# Patient Record
Sex: Female | Born: 1965 | Race: White | Hispanic: No | Marital: Married | Smoking: Former smoker
Health system: Southern US, Community
[De-identification: ages and names within clinical notes are randomized; demographics above are authoritative.]

## PROBLEM LIST (undated history)

## (undated) DIAGNOSIS — K5792 Diverticulitis of intestine, part unspecified, without perforation or abscess without bleeding: Secondary | ICD-10-CM

## (undated) DIAGNOSIS — E039 Hypothyroidism, unspecified: Secondary | ICD-10-CM

## (undated) DIAGNOSIS — E079 Disorder of thyroid, unspecified: Secondary | ICD-10-CM

## (undated) HISTORY — PX: TUBAL LIGATION: SHX77

## (undated) HISTORY — DX: Disorder of thyroid, unspecified: E07.9

## (undated) HISTORY — PX: ENDOMETRIAL ABLATION: SHX621

## (undated) HISTORY — DX: Diverticulitis of intestine, part unspecified, without perforation or abscess without bleeding: K57.92

## (undated) HISTORY — PX: LAPAROSCOPIC SUPRACERVICAL HYSTERECTOMY: SUR797

## (undated) HISTORY — PX: EYE SURGERY: SHX253

---

## 1999-01-11 ENCOUNTER — Ambulatory Visit (HOSPITAL_BASED_OUTPATIENT_CLINIC_OR_DEPARTMENT_OTHER): Admission: RE | Admit: 1999-01-11 | Discharge: 1999-01-11 | Payer: Self-pay | Admitting: Ophthalmology

## 2002-03-24 ENCOUNTER — Ambulatory Visit (HOSPITAL_COMMUNITY): Admission: RE | Admit: 2002-03-24 | Discharge: 2002-03-24 | Payer: Self-pay | Admitting: Family Medicine

## 2002-03-24 ENCOUNTER — Encounter: Payer: Self-pay | Admitting: Family Medicine

## 2004-09-25 ENCOUNTER — Ambulatory Visit: Payer: Self-pay | Admitting: Internal Medicine

## 2004-10-18 ENCOUNTER — Ambulatory Visit: Payer: Self-pay | Admitting: Internal Medicine

## 2006-02-05 ENCOUNTER — Encounter (INDEPENDENT_AMBULATORY_CARE_PROVIDER_SITE_OTHER): Payer: Self-pay | Admitting: *Deleted

## 2006-02-05 ENCOUNTER — Ambulatory Visit (HOSPITAL_COMMUNITY): Admission: RE | Admit: 2006-02-05 | Discharge: 2006-02-05 | Payer: Self-pay | Admitting: Obstetrics and Gynecology

## 2006-06-29 ENCOUNTER — Encounter (INDEPENDENT_AMBULATORY_CARE_PROVIDER_SITE_OTHER): Payer: Self-pay | Admitting: *Deleted

## 2006-06-29 ENCOUNTER — Observation Stay (HOSPITAL_COMMUNITY): Admission: RE | Admit: 2006-06-29 | Discharge: 2006-06-30 | Payer: Self-pay | Admitting: Obstetrics and Gynecology

## 2007-09-08 ENCOUNTER — Encounter: Admission: RE | Admit: 2007-09-08 | Discharge: 2007-09-08 | Payer: Self-pay | Admitting: Family Medicine

## 2009-10-03 ENCOUNTER — Encounter (INDEPENDENT_AMBULATORY_CARE_PROVIDER_SITE_OTHER): Payer: Self-pay | Admitting: *Deleted

## 2009-12-27 ENCOUNTER — Encounter: Admission: RE | Admit: 2009-12-27 | Discharge: 2009-12-27 | Payer: Self-pay | Admitting: Obstetrics and Gynecology

## 2010-02-20 ENCOUNTER — Other Ambulatory Visit: Admission: RE | Admit: 2010-02-20 | Discharge: 2010-02-20 | Payer: Self-pay | Admitting: Obstetrics and Gynecology

## 2010-09-01 ENCOUNTER — Encounter: Payer: Self-pay | Admitting: Family Medicine

## 2010-09-10 NOTE — Letter (Signed)
Summary: Colonoscopy Date Change Letter  [facility]  [address] [place], [place] [address]   Phone: [phone]  Fax: [phone]      October 03, 2009 MRN: [id number]   Joyce Davis [address] [place], [place]  [address]   Dear Ms. Ozbun,   Previously you were recommended to have a repeat colonoscopy around this time. Your chart was recently reviewed by Dr. Hedwig Morton. Juanda Chance of [facility]. Follow up colonoscopy is now recommended in March 2013. This revised recommendation is based on current, nationally recognized guidelines for colorectal cancer screening and polyp surveillance. These guidelines are endorsed by the American Cancer Society, [employer] on Colorectal Cancer as well as numerous other major medical organizations.  Please understand that our recommendation assumes that you do not have any new symptoms such as bleeding, a change in bowel habits, anemia, or significant abdominal discomfort. If you do have any concerning GI symptoms or want to discuss the guideline recommendations, please call to arrange an office visit at your earliest convenience. Otherwise we will keep you in our reminder system and contact you 1-2 months prior to the date listed above to schedule your next colonoscopy.  Thank you,  Hedwig Morton. Juanda Chance, M.D.  [facility] [phone]

## 2010-12-27 NOTE — H&P (Signed)
Joyce Davis, Joyce Davis               ACCOUNT NO.:  [id number]   MEDICAL RECORD NO.:  [id number]          PATIENT TYPE:  AMB   LOCATION:  DAY                           FACILITY:  [facility]   PHYSICIAN:  Tilda Burrow, M.D. DATE OF BIRTH:  1966-03-20   DATE OF ADMISSION:  DATE OF DISCHARGE:  LH                              HISTORY & PHYSICAL   ADMITTING DIAGNOSES:  Menorrhagia, status post failed endometrial  ablation, possible adenomyosis.   HISTORY OF PRESENT ILLNESS:  This 45 year old female 4 months' status  post endometrial ablation that resulted in 2 excellent menses but with  recurrence of her unacceptably heavy periods and with the last menses  lasting 5 days long and painful.  The patient is admitted at this time  for a laparoscopic supracervical hysterectomy.  Joyce Davis has a history  of normal Pap smears, has tolerated the anesthesia of endometrial  ablation without difficulty.  Has ultrasound-confirmed relatively upper  limits of normal sized uterus.  She is admitted for laparoscopic  supracervical hysterectomy.  The technical aspects of the procedure have  been reviewed using [place] instruction booklets for both laparoscopic  supracervical and vaginal hysterectomy performed with vaginal  laparoscopic assistance.  The patient has chosen to proceed with [facility] to  maintain optimal support.  Ovary preservation is planned.  The risks and  benefits are well explained and the patient shows a clear understanding  of issues.  The endometrial tissue from the endometrial curettage showed  benign tissue from the endometrium.   PAST MEDICAL HISTORY:  1. Essentially benign surgical tubal ligation in 1993.  2. Endometrial ablation, May 2006.   PHYSICAL EXAMINATION:  Pupils equal, round, and reactive.  NECK:  Supple.  CHEST:  Clear to auscultation.  Normal thyroid.  HEART:  Regular rate and rhythm.  LUNGS:  Clear.  ABDOMEN:  Nontender without significant surgical scars.  External  genitalia normal.  Vaginal exam shows nonpurulent secretions.  Uterus anteflexed, well supported, upper  limits of normal size.  Adnexa nontender.  Rectal support adequate.   PLAN:  Laparoscopic supracervical hysterectomy, June 29, 2006.      Tilda Burrow, M.D.  Electronically Signed     JVF/MEDQ  D:  06/28/2006  T:  06/29/2006  Job:  [id number]   cc:   [facility]

## 2010-12-27 NOTE — Op Note (Signed)
Joyce Davis, Joyce Davis               ACCOUNT NO.:  [id number]   MEDICAL RECORD NO.:  [id number]          PATIENT TYPE:  AMB   LOCATION:  DAY                           FACILITY:  [facility]   PHYSICIAN:  Tilda Burrow, M.D. DATE OF BIRTH:  03-26-1966   DATE OF PROCEDURE:  06/29/2006  DATE OF DISCHARGE:                               OPERATIVE REPORT   PREOPERATIVE DIAGNOSIS:  Menorrhagia, failed endometrial ablation.   POSTOPERATIVE DIAGNOSIS:  Menorrhagia, failed endometrial ablation.  Bilateral proximal hydrosalpinx.   OPERATION PERFORMED:  Laparoscopic supracervical hysterectomy.   SURGEON:  Tilda Burrow, M.D.   ASSISTANT:  Lazaro Arms, M.D.   ANESTHESIA:  General.   COMPLICATIONS:  None.   FINDINGS:  Mobile, well-supported uterus, small ovaries.  Small proximal  hydrosalpinx on each side, status post tubal ligation and ablation.   DESCRIPTION OF PROCEDURE:  The patient was taken to the operating room,  prepped and draped for combined abdominal and vaginal procedure with the  cervix grasped with single toothed tenaculum and Foley catheter in  place.  An infraumbilical vertical 1 cm skin incision was made as well  as the transverse suprapubic incision and bilateral lower quadrant  incisions of similar length.  A Veress needle was introduced through the  umbilicus.  Pneumoperitoneum achieved under 12 mmHg after water droplet  test confirmed satisfactory intraperitoneal positioning of the needle  tip.  Insufflation of the abdomen was followed by insertion of the 10 mm  laparoscopic trocar through the umbilicus under direct camera guidance  and then inspection of the abdomen showed no evidence of trauma to  internal organs.  Suprapubic trocar was introduced under direct  visualization as was the right lower quadrant 12 mm trocar.  The left  lower quadrant trocar was also a 5 mm trocar.  Attention was then  directed to the uterus.  Dr. Despina Hidden held the position on the left side  of  the patient and we started on that side with taking down of the broad  ligament, utero-ovarian ligament and round ligament with the Harmonic  scalpel taking serial small bites with good hemostasis.  On the right  side, a similar technique was used, with a small amount of oozing from  the pedicle of the ovary.  The bladder flap was developed anteriorly.  Attention was then directed to the uterine vessels.  On each side, the  uterine vessels could be clearly identified and broad area coagulated  using Harmonic scalpel on low power setting and then the vessels were  transected bilaterally.  The cervix was then amputated off at the level  of the insertion of the uterosacral ligaments into the posterior aspect  of the cervix, and the surgical specimen removed by morcellation and  removing it through the morcellator plate which had been placed in the  right lower quadrant.  Specimen was removed.  Inspection of the pelvis  followed and there was a small area with a large open vein on the pelvic  floor just anterior and to the right of the insertion of the right  uterosacral ligament into the cervical  stump.  This was coagulated.  We  also had some oozing from the pedicle at the end of the right utero-  ovarian ligament and this was carefully coagulated under low pressure  Harmonic scalpel settings to achieve hemostasis.  Copious pelvic  irrigation was performed under low pressure to see if there was any  additional oozing and there was none.  The upper abdomen was inspected  and no tissue samples identified that had been tossed out of the field  of vision by the extraction process.  Deflation of the abdomen followed,  the surgical instruments having been removed.  The right lower quadrant  and umbilical sites were closed at the fascial level and then all five  sites were closed with subcuticular suture of Dexon followed by surgical  glue over the incision.  Hemostasis was good.  The estimated  blood loss  was 50 mL.  Condition to recovery good.  Patient moved herself off the  table.      Tilda Burrow, M.D.  Electronically Signed     JVF/MEDQ  D:  06/29/2006  T:  06/29/2006  Job:  [id number]

## 2010-12-27 NOTE — Op Note (Signed)
Joyce Davis, Joyce Davis               ACCOUNT NO.:  [id number]   MEDICAL RECORD NO.:  [id number]          PATIENT TYPE:  AMB   LOCATION:  DAY                           FACILITY:  [facility]   PHYSICIAN:  Tilda Burrow, M.D. DATE OF BIRTH:  September 02, 1965   DATE OF PROCEDURE:  DATE OF DISCHARGE:                                 OPERATIVE REPORT   PREOPERATIVE DIAGNOSIS:  Menorrhagia.   POSTOPERATIVE DIAGNOSES:  Menorrhagia.  Endometrial hyperplasia.   PROCEDURE:  Hysteroscopy, dilatation and curettage, endometrial ablation.   SURGEON:  Ferguson.   ASSISTANT:  None.   ANESTHESIA:  General.   COMPLICATIONS:  Technically challenging procedure with hysteroscopy  visualization of the endometrial cavity limited.  Thickened endometrium  requiring full suction curettage, obtaining generous amounts of tissue.   DETAILS OF PROCEDURE:  The patient was taken to the OR, prepped and draped  for vaginal procedure.  A single-tooth tenaculum was used to grasp the  cervix through a standard Graves bivalve speculum, with the cervix grasped,  placed under traction, the uterus sounded to 11 cm, with serial dilation to  27 [place] performed.  Hysteroscopy was then initiated with the 30-degree  angle, 27-mm diameter, rigid hysteroscope.  The hysteroscope could be  introduced and once in position we had trouble getting the uterus to  irrigate.  It became apparent that one of the internal mechanisms was not  functioning optimally and the irrigation fluid was passing from the insert  to the evacuation port without passage through the uterine cavity.  Nonetheless, with cutting on and off of the inflow and outflow ports in  sequence, we were able to flush out the uterine cavity enough to visualize  an endometrial cavity that had extensive shaggy polypoid-appearing  endometrial cavity surface in all quadrants.  We then proceeded with smooth  sharp curettage which started to obtain generous chunks of tissue from all  surfaces.  The 8-mm curved suction curette was then used under 45 mmHg  pressure and in a circumferential manner.  Smooth sharp curettage at this  time confirmed the uterine cavity to actually have decreased in size and had  a uniform gritty feeling in four quadrants.   We then proceeded with endometrial ablation.  The Gynecare ThermaChoice III  endometrial ablation device was positioned, insufflated, sealed to 15 cc,  confirmed for intact membrane, placed in the uterine cavity as per protocol  and dilated with a total of 35 cc to achieve 150 mmHg.  The uterine cavity  continued to expand in order to allow the balloon to be filled further.  At  38 cc of fluid volume, we were able to initiate the procedure.  A three-  minute warming phase was followed by eight-minute thermal ablation.  During  this time there was a gradual decline in the pressure but no evidence of any  leakage of fluid.  We felt this represented a continued expansion of the  uterine cavity.  At the completion of 11+ minutes of procedure, we were able  to successfully complete the procedure and pressure was just above 100 mmHg  on the  balloon.  Upon extracting the balloon, we were only able to remove  about 10 cc of fluid before the Luer lock on the end of the Gynecare  ThermaChoice device froze up and became nonfunctional and so the endometrial  ablation device was extracted intact.  The balloon, which remained to  careful visual inspection, was then opened and fluid caught as much as  possible; 33 cc of fluid was actually measured with a small amount of  spillage associated with the process.  It was felt that all the fluid was  accounted for.   Confirmatory hysteroscopy was then performed again.  Photos 1 through 8 are  of poor technical quality but represent visualization of the endometrial  cavity post procedure.  Photos 2 and 3 show a blanched scarred endometrium.  Photo 4 is the right cornual area which was well  changed by thermal changes.  Photos 5 and 6 show the bubbles in the apex of the uterine fundus from the  repeat hysteroscopy.  There is a small area in photos 6 and 7 representing a  tiny portion of the left cornual area of the hysteroscopy that does show  some red tissue that may have not had complete thermal changes.  This was an  extremely tiny area compared to the size of the entire uterine cavity.  Photo 8 shows a small chunk of debris from just below the area of  noncauterized tissue.  This may represent endometrial debris that prevented  satisfactory ablation.  Given the challenges we had had with the procedure,  we felt that the results had been satisfactory to date and so no further  efforts were forthcoming to address the area in the left cornual area.   The patient had paracervical block applied using 22 cc of 0.5% Marcaine with  epinephrine and then was taken to the recovery room in stable condition.  Sponge and needle counts were correct.      Tilda Burrow, M.D.  Electronically Signed     JVF/MEDQ  D:  02/05/2006  T:  02/05/2006  Job:  [id number]

## 2011-05-05 ENCOUNTER — Other Ambulatory Visit: Payer: Self-pay | Admitting: Obstetrics and Gynecology

## 2011-05-05 DIAGNOSIS — Z1231 Encounter for screening mammogram for malignant neoplasm of breast: Secondary | ICD-10-CM

## 2011-05-12 ENCOUNTER — Ambulatory Visit (HOSPITAL_COMMUNITY)
Admission: RE | Admit: 2011-05-12 | Discharge: 2011-05-12 | Disposition: A | Discharge status: Home / Self Care | Payer: BC Managed Care – PPO | Source: Ambulatory Visit | Attending: Family Medicine | Admitting: Family Medicine

## 2011-05-12 ENCOUNTER — Other Ambulatory Visit (HOSPITAL_COMMUNITY): Payer: Self-pay | Admitting: Family Medicine

## 2011-05-12 DIAGNOSIS — M79609 Pain in unspecified limb: Secondary | ICD-10-CM

## 2011-05-12 DIAGNOSIS — S8990XA Unspecified injury of unspecified lower leg, initial encounter: Secondary | ICD-10-CM | POA: Insufficient documentation

## 2011-05-12 DIAGNOSIS — M7989 Other specified soft tissue disorders: Secondary | ICD-10-CM

## 2011-05-12 DIAGNOSIS — W108XXA Fall (on) (from) other stairs and steps, initial encounter: Secondary | ICD-10-CM | POA: Insufficient documentation

## 2011-05-16 ENCOUNTER — Ambulatory Visit: Payer: Self-pay

## 2011-05-23 ENCOUNTER — Ambulatory Visit
Admission: RE | Admit: 2011-05-23 | Discharge: 2011-05-23 | Disposition: A | Discharge status: Home / Self Care | Payer: BC Managed Care – PPO | Source: Ambulatory Visit | Attending: Obstetrics and Gynecology | Admitting: Obstetrics and Gynecology

## 2011-05-23 DIAGNOSIS — Z1231 Encounter for screening mammogram for malignant neoplasm of breast: Secondary | ICD-10-CM

## 2011-06-18 ENCOUNTER — Other Ambulatory Visit: Payer: Self-pay | Admitting: Adult Health

## 2011-06-18 ENCOUNTER — Other Ambulatory Visit (HOSPITAL_COMMUNITY)
Admission: RE | Admit: 2011-06-18 | Discharge: 2011-06-18 | Disposition: A | Discharge status: Home / Self Care | Payer: BC Managed Care – PPO | Source: Ambulatory Visit | Attending: Obstetrics and Gynecology | Admitting: Obstetrics and Gynecology

## 2011-06-18 DIAGNOSIS — Z01419 Encounter for gynecological examination (general) (routine) without abnormal findings: Secondary | ICD-10-CM | POA: Insufficient documentation

## 2011-10-08 ENCOUNTER — Encounter: Payer: Self-pay | Admitting: Internal Medicine

## 2012-05-24 ENCOUNTER — Other Ambulatory Visit: Payer: Self-pay | Admitting: Obstetrics and Gynecology

## 2012-05-24 DIAGNOSIS — Z1231 Encounter for screening mammogram for malignant neoplasm of breast: Secondary | ICD-10-CM

## 2012-06-03 ENCOUNTER — Encounter: Payer: Self-pay | Admitting: Internal Medicine

## 2012-06-22 ENCOUNTER — Other Ambulatory Visit: Payer: Self-pay | Admitting: Adult Health

## 2012-06-22 ENCOUNTER — Other Ambulatory Visit (HOSPITAL_COMMUNITY)
Admission: RE | Admit: 2012-06-22 | Discharge: 2012-06-22 | Disposition: A | Discharge status: Home / Self Care | Payer: BC Managed Care – PPO | Source: Ambulatory Visit | Attending: Obstetrics and Gynecology | Admitting: Obstetrics and Gynecology

## 2012-06-22 DIAGNOSIS — Z01419 Encounter for gynecological examination (general) (routine) without abnormal findings: Secondary | ICD-10-CM | POA: Insufficient documentation

## 2012-06-22 DIAGNOSIS — Z1151 Encounter for screening for human papillomavirus (HPV): Secondary | ICD-10-CM | POA: Insufficient documentation

## 2012-07-06 ENCOUNTER — Ambulatory Visit
Admission: RE | Admit: 2012-07-06 | Discharge: 2012-07-06 | Disposition: A | Discharge status: Home / Self Care | Payer: BC Managed Care – PPO | Source: Ambulatory Visit | Attending: Obstetrics and Gynecology | Admitting: Obstetrics and Gynecology

## 2012-07-06 DIAGNOSIS — Z1231 Encounter for screening mammogram for malignant neoplasm of breast: Secondary | ICD-10-CM

## 2013-08-17 ENCOUNTER — Other Ambulatory Visit: Payer: Self-pay

## 2013-08-17 DIAGNOSIS — Z1231 Encounter for screening mammogram for malignant neoplasm of breast: Secondary | ICD-10-CM

## 2013-09-08 ENCOUNTER — Ambulatory Visit
Admission: RE | Admit: 2013-09-08 | Discharge: 2013-09-08 | Disposition: A | Discharge status: Home / Self Care | Payer: BC Managed Care – PPO | Source: Ambulatory Visit

## 2013-09-08 DIAGNOSIS — Z1231 Encounter for screening mammogram for malignant neoplasm of breast: Secondary | ICD-10-CM

## 2014-11-13 ENCOUNTER — Other Ambulatory Visit: Payer: Self-pay

## 2014-11-13 DIAGNOSIS — Z1231 Encounter for screening mammogram for malignant neoplasm of breast: Secondary | ICD-10-CM

## 2014-11-22 ENCOUNTER — Ambulatory Visit
Admission: RE | Admit: 2014-11-22 | Discharge: 2014-11-22 | Disposition: A | Discharge status: Home / Self Care | Payer: BLUE CROSS/BLUE SHIELD | Source: Ambulatory Visit

## 2014-11-22 DIAGNOSIS — Z1231 Encounter for screening mammogram for malignant neoplasm of breast: Secondary | ICD-10-CM

## 2016-01-02 ENCOUNTER — Other Ambulatory Visit: Payer: Self-pay

## 2016-01-02 DIAGNOSIS — Z1231 Encounter for screening mammogram for malignant neoplasm of breast: Secondary | ICD-10-CM

## 2016-01-29 ENCOUNTER — Ambulatory Visit
Admission: RE | Admit: 2016-01-29 | Discharge: 2016-01-29 | Disposition: A | Discharge status: Home / Self Care | Payer: BLUE CROSS/BLUE SHIELD | Source: Ambulatory Visit

## 2016-01-29 DIAGNOSIS — Z1231 Encounter for screening mammogram for malignant neoplasm of breast: Secondary | ICD-10-CM

## 2016-05-28 ENCOUNTER — Encounter: Payer: Self-pay | Admitting: Adult Health

## 2016-05-28 ENCOUNTER — Ambulatory Visit (INDEPENDENT_AMBULATORY_CARE_PROVIDER_SITE_OTHER): Payer: BLUE CROSS/BLUE SHIELD | Admitting: Adult Health

## 2016-05-28 ENCOUNTER — Other Ambulatory Visit (HOSPITAL_COMMUNITY)
Admission: RE | Admit: 2016-05-28 | Discharge: 2016-05-28 | Disposition: A | Discharge status: Home / Self Care | Payer: BLUE CROSS/BLUE SHIELD | Source: Ambulatory Visit | Attending: Adult Health | Admitting: Adult Health

## 2016-05-28 VITALS — BP 124/70 | HR 64 | Ht 65.25 in | Wt 174.5 lb

## 2016-05-28 DIAGNOSIS — Z01419 Encounter for gynecological examination (general) (routine) without abnormal findings: Secondary | ICD-10-CM

## 2016-05-28 DIAGNOSIS — Z1212 Encounter for screening for malignant neoplasm of rectum: Secondary | ICD-10-CM | POA: Diagnosis not present

## 2016-05-28 DIAGNOSIS — Z1151 Encounter for screening for human papillomavirus (HPV): Secondary | ICD-10-CM | POA: Insufficient documentation

## 2016-05-28 DIAGNOSIS — L918 Other hypertrophic disorders of the skin: Secondary | ICD-10-CM

## 2016-05-28 DIAGNOSIS — Z1211 Encounter for screening for malignant neoplasm of colon: Secondary | ICD-10-CM

## 2016-05-28 DIAGNOSIS — M79621 Pain in right upper arm: Secondary | ICD-10-CM

## 2016-05-28 DIAGNOSIS — R635 Abnormal weight gain: Secondary | ICD-10-CM

## 2016-05-28 LAB — HEMOCCULT GUIAC POC 1CARD (OFFICE): Fecal Occult Blood, POC: NEGATIVE

## 2016-05-28 NOTE — Patient Instructions (Addendum)
Physical in 1 year,pap in 3 if normal Mammogram yearly Labs with PCP tomorrow See Dr [name] for colonoscopy See orthopedics

## 2016-05-28 NOTE — Progress Notes (Signed)
Patient ID: Joyce FlirtJennifer B Davis, female   DOB: 1965/10/16, 50 y.o.   MRN: [id number] History of Present Illness: Joyce Davis is a 50 year old white female,married in for a well woman gyn exam and pap.[facility].    Current Medications, Allergies, Past Medical History, Past Surgical History, Family History and Social History were reviewed in Joyce Davis.     Review of Systems: Patient denies any headaches, hearing loss, fatigue, blurred vision, shortness of breath, chest pain, abdominal pain, problems with bowel movements, urination, or intercourse. No joint pain or mood swings.Has pain in right arm for about 2 weeks. Has gained about 25 lbs since June when Mom died.    Physical Exam:BP 124/70 (BP Location: Left Arm, Patient Position: Sitting, Cuff Size: Normal)   Pulse 64   Ht 5' 5.25" (1.657 m)   Wt 174 lb 8 oz (79.2 kg)   BMI 28.82 kg/m  General:  Well developed, well nourished, no acute distress Skin:  Warm and dry Neck:  Midline trachea, normal thyroid, good ROM, no lymphadenopathy Lungs; Clear to auscultation bilaterally Breast:  No dominant palpable mass, retraction, or nipple discharge,has skin tag in left areola (she wants removed can see in bathing suit) Cardiovascular: Regular rate and rhythm Abdomen:  Soft, non tender, no hepatosplenomegaly Pelvic:  External genitalia is normal in appearance, no lesions.  The vagina is normal in appearance. Urethra has no lesions or masses. The cervix is bulbous,pap with HPV performed.  Uterus is absent.  No adnexal masses or tenderness noted.Bladder is non tender, no masses felt. Rectal: Good sphincter tone, no polyps, or hemorrhoids felt.  Hemoccult negative. Extremities/musculoskeletal:  No swelling or varicosities noted, no clubbing or cyanosis,has pain in right arm at bursa and can not raise arm out sideways, may be rotator cuff, see orthopedic doctor in [place],has relationship already Psych:   No mood changes, alert and cooperative,seems happy PHQ 2 score 0. Had colonoscopy at [age] due family hx of polyps, needs screening colonoscopy before end of year, insurance changing. Has appt for labs with PCP in am.  Impression: 1. Encounter for gynecological examination with Papanicolaou smear of cervix   2. Pain in right upper arm   3. Weight gain   4. Screening for colon cancer   5. Skin tag       Plan: Physical in 1 year,pap in 3 if normal Mammogram yearly Labs with PCP tomorrow See Joyce Davis for colonoscopy,referral made See orthopedics for arm

## 2016-05-30 LAB — CYTOLOGY - PAP
Diagnosis: NEGATIVE
HPV: NOT DETECTED

## 2016-06-06 ENCOUNTER — Ambulatory Visit (INDEPENDENT_AMBULATORY_CARE_PROVIDER_SITE_OTHER): Payer: BLUE CROSS/BLUE SHIELD | Admitting: Obstetrics and Gynecology

## 2016-06-06 ENCOUNTER — Encounter: Payer: Self-pay | Admitting: Obstetrics and Gynecology

## 2016-06-06 VITALS — BP 124/80 | HR 68 | Ht 65.5 in | Wt 173.8 lb

## 2016-06-06 DIAGNOSIS — L918 Other hypertrophic disorders of the skin: Secondary | ICD-10-CM

## 2016-06-06 NOTE — Progress Notes (Signed)
Patient ID: Joyce FlirtJennifer B Davis, female   DOB: [birth year], 50 y.o.   MRN: [id number] SKIN TAG REMOVAL PROCEDURE NOTE The patient's identification was confirmed and consent was obtained. This procedure was performed by Tilda BurrowJohn V Hobie Kohles at 11:44 AM Site & Appearance: Left areola  Sterile procedures observed: Yes Anesthetic used: 1% Lidocaine without epinephrine and 1.5 cc used AgNO3 applied: No  Skin tag excised under local anesthesia, site covered with dry, sterile dressing.  Patient tolerated procedure well without complications. Minimal blood loss. Instructions for care discussed verbally and patient provided with N/A. additional written instructions for homecare and f/u.   A:  1. Skin tag to left areola  P: 1. Excision of left areola skin tag done, discarded  By signing my name below, I, Soijett Blue, attest that this documentation has been prepared under the direction and in the presence of Tilda BurrowJohn V Bynum Mccullars, MD. Electronically Signed: Soijett Blue, ED Scribe. 06/06/16. 11:44 AM.  I personally performed the services described in this documentation, which was SCRIBED in my presence. The recorded information has been reviewed and considered accurate. It has been edited as necessary during review. Tilda BurrowFERGUSON,Kenzlee Fishburn V, MD

## 2016-06-11 ENCOUNTER — Telehealth: Payer: Self-pay

## 2016-06-11 NOTE — Telephone Encounter (Signed)
Triaged today.  

## 2016-06-12 NOTE — Telephone Encounter (Signed)
Gastroenterology Pre-Procedure Review  Request Date: 06/11/2016 Requesting Physician: Cyril MourningJennifer Griffin, NP  PATIENT REVIEW QUESTIONS: The patient responded to the following health history questions as indicated:    Pt said [place] 50 [place]. She has a family hx of polyps and her grandmother died with colon cancer at age 52.   1. Diabetes Melitis: no 2. Joint replacements in the past 12 months: no 3. Major health problems in the past 3 months: no 4. Has an artificial valve or MVP: no 5. Has a defibrillator: no 6. Has been advised in past to take antibiotics in advance of a procedure like teeth cleaning: no 7. Family history of colon cancer: Grandmother at age 52 8. Alcohol Use: Just occasionally, maybe once a month 9. History of sleep apnea: no  10. History of coronary artery or other vascular stents placed within the last 12 months: no    MEDICATIONS & ALLERGIES:    Patient reports the following regarding taking any blood thinners:   Plavix? no Aspirin? no Coumadin? no Brilinta? no Xarelto? no Eliquis? no Pradaxa? no Savaysa? no Effient? no  Patient confirms/reports the following medications:  Current Outpatient Prescriptions  Medication Sig Dispense Refill  . levothyroxine (SYNTHROID, LEVOTHROID) 112 MCG tablet Take 125 mcg by mouth daily.   2   No current facility-administered medications for this visit.     Patient confirms/reports the following allergies:  Allergies  Allergen Reactions  . Bee Venom Anaphylaxis and Swelling  . Penicillins Hives    No orders of the defined types were placed in this encounter.   AUTHORIZATION INFORMATION Primary Insurance:   ID #:   Group #:  Pre-Cert / Auth required:  Pre-Cert / Auth #:   Secondary Insurance:   ID #:  Group #:  Pre-Cert / Auth required: Pre-Cert / Auth #:   SCHEDULE INFORMATION: Procedure has been scheduled as follows:  Date:  06/20/2016                    Time: 8:30 AM Location[facility] Stay  This Gastroenterology Pre-Precedure Review Form is being routed to the following provider(s): R. Roetta SessionsMichael Rourk, MD

## 2016-06-17 ENCOUNTER — Other Ambulatory Visit: Payer: Self-pay

## 2016-06-17 DIAGNOSIS — Z1211 Encounter for screening for malignant neoplasm of colon: Secondary | ICD-10-CM

## 2016-06-17 MED ORDER — PEG-KCL-NACL-NASULF-NA ASC-C 100 G PO SOLR: 1.0000 | ORAL | 0 refills | Status: DC

## 2016-06-17 NOTE — Telephone Encounter (Signed)
OK to schedule

## 2016-06-17 NOTE — Telephone Encounter (Signed)
Pt is set up and she has her instructions and understands everything. Orders are in and rx sent to pharmacy

## 2016-06-20 ENCOUNTER — Encounter (HOSPITAL_COMMUNITY)
Admission: RE | Disposition: A | Discharge status: Home / Self Care | Payer: Self-pay | Source: Ambulatory Visit | Attending: Internal Medicine

## 2016-06-20 ENCOUNTER — Encounter (HOSPITAL_COMMUNITY): Payer: Self-pay | Admitting: *Deleted

## 2016-06-20 ENCOUNTER — Ambulatory Visit (HOSPITAL_COMMUNITY)
Admission: RE | Admit: 2016-06-20 | Discharge: 2016-06-20 | Disposition: A | Discharge status: Home / Self Care | Payer: BLUE CROSS/BLUE SHIELD | Source: Ambulatory Visit | Attending: Internal Medicine | Admitting: Internal Medicine

## 2016-06-20 DIAGNOSIS — Z1212 Encounter for screening for malignant neoplasm of rectum: Secondary | ICD-10-CM | POA: Diagnosis not present

## 2016-06-20 DIAGNOSIS — Z1211 Encounter for screening for malignant neoplasm of colon: Secondary | ICD-10-CM | POA: Diagnosis present

## 2016-06-20 DIAGNOSIS — Z8371 Family history of colonic polyps: Secondary | ICD-10-CM

## 2016-06-20 DIAGNOSIS — K5730 Diverticulosis of large intestine without perforation or abscess without bleeding: Secondary | ICD-10-CM | POA: Diagnosis not present

## 2016-06-20 DIAGNOSIS — Z87891 Personal history of nicotine dependence: Secondary | ICD-10-CM | POA: Insufficient documentation

## 2016-06-20 DIAGNOSIS — E039 Hypothyroidism, unspecified: Secondary | ICD-10-CM | POA: Insufficient documentation

## 2016-06-20 DIAGNOSIS — Z79899 Other long term (current) drug therapy: Secondary | ICD-10-CM | POA: Diagnosis not present

## 2016-06-20 HISTORY — PX: COLONOSCOPY: SHX5424

## 2016-06-20 HISTORY — DX: Hypothyroidism, unspecified: E03.9

## 2016-06-20 SURGERY — COLONOSCOPY
Anesthesia: Moderate Sedation

## 2016-06-20 MED ORDER — SODIUM CHLORIDE 0.9 % IV SOLN
INTRAVENOUS | Status: DC
  Administered 2016-06-20: 08:00:00 via INTRAVENOUS

## 2016-06-20 MED ORDER — ONDANSETRON HCL 4 MG/2ML IJ SOLN
INTRAMUSCULAR | Status: DC | PRN
  Administered 2016-06-20: 4 mg via INTRAVENOUS

## 2016-06-20 MED ORDER — MEPERIDINE HCL 100 MG/ML IJ SOLN
INTRAMUSCULAR | Status: DC | PRN
  Administered 2016-06-20 (×2): 50 mg via INTRAVENOUS

## 2016-06-20 MED ORDER — MIDAZOLAM HCL 5 MG/5ML IJ SOLN
INTRAMUSCULAR | Status: AC
  Filled 2016-06-20: qty 10

## 2016-06-20 MED ORDER — STERILE WATER FOR IRRIGATION IR SOLN
Status: DC | PRN
  Administered 2016-06-20: 08:00:00

## 2016-06-20 MED ORDER — MIDAZOLAM HCL 5 MG/5ML IJ SOLN
INTRAMUSCULAR | Status: DC | PRN
  Administered 2016-06-20 (×2): 1 mg via INTRAVENOUS
  Administered 2016-06-20: 2 mg via INTRAVENOUS
  Administered 2016-06-20: 1 mg via INTRAVENOUS
  Administered 2016-06-20: 2 mg via INTRAVENOUS

## 2016-06-20 MED ORDER — ONDANSETRON HCL 4 MG/2ML IJ SOLN
INTRAMUSCULAR | Status: AC
  Filled 2016-06-20: qty 2

## 2016-06-20 MED ORDER — MEPERIDINE HCL 100 MG/ML IJ SOLN
INTRAMUSCULAR | Status: AC
  Filled 2016-06-20: qty 2

## 2016-06-20 NOTE — H&P (Signed)
@LOGO@   Primary Care Physician:  GOLDING, JOHN CABOT, MD Primary Gastroenterologist:  Dr. Rourk  Pre-Procedure History & Physical: HPI:  Joyce Davis is a 50 y.o. female is here for a screening colonoscopy. Mother followed with a history of polyps. Was told at age 38 to come back every 5 years for colonoscopy. Negative colonoscopy at age 38. No bowel symptoms. No family history of colon cancer in first-degree relatives.  Past Medical History:  Diagnosis Date  . Hypothyroidism   . Thyroid disease     Past Surgical History:  Procedure Laterality Date  . ENDOMETRIAL ABLATION    . EYE SURGERY Left   . LAPAROSCOPIC SUPRACERVICAL HYSTERECTOMY    . TUBAL LIGATION      Prior to Admission medications   Medication Sig Start Date End Date Taking? Authorizing Provider  ibuprofen (ADVIL,MOTRIN) 200 MG tablet Take 400 mg by mouth every 6 (six) hours as needed for headache or moderate pain.   Yes Historical Provider, MD  levothyroxine (SYNTHROID, LEVOTHROID) 125 MCG tablet Take 125 mcg by mouth daily. 05/30/16  Yes Historical Provider, MD  peg 3350 powder (MOVIPREP) 100 g SOLR Take 1 kit (200 g total) by mouth as directed. 06/17/16  Yes Robert M Rourk, MD  phentermine 15 MG capsule Take 15 mg by mouth every other day.  05/29/16  Yes Historical Provider, MD    Allergies as of 06/17/2016 - Review Complete 06/17/2016  Allergen Reaction Noted  . Bee venom Anaphylaxis and Swelling 05/28/2016  . Penicillins Hives 05/28/2016    Family History  Problem Relation Age of Onset  . Heart attack Paternal Grandfather   . Alzheimer's disease Paternal Grandmother   . Colon cancer Maternal Grandmother   . Cancer Maternal Grandfather     lung  . COPD Father   . Stroke Mother   . Stroke Brother   . Breast cancer Sister   . Other Sister     mouth stays dry  . Stroke Sister     mini  . Other Sister     legs swell    Social History   Social History  . Marital status: Married    Spouse name:  N/A  . Number of children: N/A  . Years of education: N/A   Occupational History  . Not on file.   Social History Main Topics  . Smoking status: Former Smoker    Years: 7.00    Types: Cigarettes  . Smokeless tobacco: Never Used  . Alcohol use Yes     Comment: occ  . Drug use: No  . Sexual activity: Yes    Birth control/ protection: Surgical     Comment: supracervical hyst   Other Topics Concern  . Not on file   Social History Narrative  . No narrative on file    Review of Systems: See HPI, otherwise negative ROS  Physical Exam: BP 129/89   Pulse 74   Temp 97.7 F (36.5 C) (Oral)   Resp 17   Ht 5' 5.5" (1.664 m)   Wt 173 lb (78.5 kg)   SpO2 97%   BMI 28.35 kg/m  General:   Alert,  Well-developed, well-nourished, pleasant and cooperative in NAD Head:  Normocephalic and atraumatic. Lungs:  Clear throughout to auscultation.   No wheezes, crackles, or rhonchi. No acute distress. Heart:  Regular rate and rhythm; no murmurs, clicks, rubs,  or gallops. Abdomen:  Soft, nontender and nondistended. No masses, hepatosplenomegaly or hernias noted. Normal bowel sounds, without   guarding, and without rebound.     Impression:   Joyce Davis is now here to undergo a screening colonoscopy.  Risks, benefits, limitations, imponderables and alternatives regarding colonoscopy have been reviewed with the patient. Questions have been answered. All parties agreeable.       Notice:  This dictation was prepared with Dragon dictation along with smaller phrase technology. Any transcriptional errors that result from this process are unintentional and may not be corrected upon review.  

## 2016-06-20 NOTE — Op Note (Signed)
[facility] Patient Name: Joyce Davis Procedure Date: 06/20/2016 8:06 AM MRN: [id number] Date of Birth: 12-Feb-1966 Attending MD: Gennette Pac , MD CSN: [id number] Age: 50 Admit Type: Outpatient Procedure:                Colonoscopy - High risk screening examination Indications:              Colon cancer screening in patient at increased                            risk: Family history of 1st-degree relative with                            colon polyps Providers:                Gennette Pac, MD, Loma Messing B. Patsy Lager, RN,                            Lollie Marrow. Lake, Pensions consultant Referring MD:              Medicines:                Midazolam 7 mg IV, Meperidine 100 mg IV,                            Ondansetron 4 mg IV Complications:            No immediate complications. Estimated Blood Loss:     Estimated blood loss: none. Procedure:                Pre-Anesthesia Assessment:                           - Prior to the procedure, a History and Physical                            was performed, and patient medications and                            allergies were reviewed. The patient's tolerance of                            previous anesthesia was also reviewed. The risks                            and benefits of the procedure and the sedation                            options and risks were discussed with the patient.                            All questions were answered, and informed consent                            was obtained. Prior Anticoagulants: The patient has  taken no previous anticoagulant or antiplatelet                            agents. ASA Grade Assessment: II - A patient with                            mild systemic disease. After reviewing the risks                            and benefits, the patient was deemed in                            satisfactory condition to undergo the procedure.                           After  obtaining informed consent, the colonoscope                            was passed under direct vision. Throughout the                            procedure, the patient's blood pressure, pulse, and                            oxygen saturations were monitored continuously. The                            EC-[id number] ([id number]) scope was introduced through                            the anus and advanced to the the cecum, identified                            by appendiceal orifice and ileocecal valve. The                            colonoscopy was performed without difficulty. The                            patient tolerated the procedure well. The quality                            of the bowel preparation was adequate. The entire                            colon was well visualized. The ileocecal valve,                            appendiceal orifice, and rectum were photographed. Scope In: 8:29:31 AM Scope Out: 8:46:01 AM Scope Withdrawal Time: 0 hours 8 minutes 11 seconds  Total Procedure Duration: 0 hours 16 minutes 30 seconds  Findings:      The perianal and digital rectal examinations were normal.      Scattered small and large-mouthed  diverticula were found in the entire       colon.      The exam was otherwise without abnormality on direct and retroflexion       views aside from internal hemorrhoids. Impression:               - Diverticulosis in the entire examined colon.                           - The examination was otherwise normal on direct                            and retroflexion views.                           - No specimens collected. Moderate Sedation:      Moderate (conscious) sedation was administered by the endoscopy nurse       and supervised by the endoscopist. The following parameters were       monitored: oxygen saturation, heart rate, blood pressure, respiratory       rate, EKG, adequacy of pulmonary ventilation, and response to care.       Total physician  intraservice time was 26 minutes. Recommendation:           - Written discharge instructions were provided to                            the patient.                           - Patient has a contact number available for                            emergencies. The signs and symptoms of potential                            delayed complications were discussed with the                            patient. Return to normal activities tomorrow.                            Written discharge instructions were provided to the                            patient.                           - Resume previous diet.                           - Continue present medications.                           - Repeat colonoscopy in 5 years for screening                            purposes.                           -  Return to GI clinic PRN. Procedure Code(s):        --- Professional ---                           [phone], Colonoscopy, flexible; diagnostic, including                            collection of specimen(s) by brushing or washing,                            when performed (separate procedure)                           99152, Moderate sedation services provided by the                            same physician or other qualified health care                            professional performing the diagnostic or                            therapeutic service that the sedation supports,                            requiring the presence of an independent trained                            observer to assist in the monitoring of the                            patient's level of consciousness and physiological                            status; initial 15 minutes of intraservice time,                            patient age 34 years or older                           [id number], Moderate sedation services; each additional                            15 minutes intraservice time Diagnosis Code(s):        --- Professional  ---                           Z83.71, Family history of colonic polyps                           K57.30, Diverticulosis of large intestine without                            perforation or abscess without bleeding CPT copyright 2016 American Medical Association. All rights reserved. The codes documented in this  report are preliminary and upon coder review may  be revised to meet current compliance requirements. Gerrit Friends. Kalii Chesmore, MD Gennette Pac, MD 06/20/2016 8:57:07 AM This report has been signed electronically. Number of Addenda: 0

## 2016-06-20 NOTE — Discharge Instructions (Addendum)
Colonoscopy Discharge Instructions  Read the instructions outlined below and refer to this sheet in the next few weeks. These discharge instructions provide you with general information on caring for yourself after you leave the hospital. Your doctor may also give you specific instructions. While your treatment has been planned according to the most current medical practices available, unavoidable complications occasionally occur. If you have any problems or questions after discharge, call Dr. [name] at [phone]. ACTIVITY  You may resume your regular activity, but move at a slower pace for the next 24 hours.   Take frequent rest periods for the next 24 hours.   Walking will help get rid of the air and reduce the bloated feeling in your belly (abdomen).   No driving for 24 hours (because of the medicine (anesthesia) used during the test).    Do not sign any important legal documents or operate any machinery for 24 hours (because of the anesthesia used during the test).  NUTRITION  Drink plenty of fluids.   You may resume your normal diet as instructed by your doctor.   Begin with a light meal and progress to your normal diet. Heavy or fried foods are harder to digest and may make you feel sick to your stomach (nauseated).   Avoid alcoholic beverages for 24 hours or as instructed.  MEDICATIONS  You may resume your normal medications unless your doctor tells you otherwise.  WHAT YOU CAN EXPECT TODAY  Some feelings of bloating in the abdomen.   Passage of more gas than usual.   Spotting of blood in your stool or on the toilet paper.  IF YOU HAD POLYPS REMOVED DURING THE COLONOSCOPY:  No aspirin products for 7 days or as instructed.   No alcohol for 7 days or as instructed.   Eat a soft diet for the next 24 hours.  FINDING OUT THE RESULTS OF YOUR TEST Not all test results are available during your visit. If your test results are not back during the visit, make an appointment  with your caregiver to find out the results. Do not assume everything is normal if you have not heard from your caregiver or the medical facility. It is important for you to follow up on all of your test results.  SEEK IMMEDIATE MEDICAL ATTENTION IF:  You have more than a spotting of blood in your stool.   Your belly is swollen (abdominal distention).   You are nauseated or vomiting.   You have a temperature over 101.   You have abdominal pain or discomfort that is severe or gets worse throughout the day.    Colonic diverticulosis information provided  Repeat screening colonoscopy in 5 years   Diverticulosis Diverticulosis is the condition that develops when small pouches (diverticula) form in the wall of your colon. Your colon, or large intestine, is where water is absorbed and stool is formed. The pouches form when the inside layer of your colon pushes through weak spots in the outer layers of your colon. CAUSES  No one knows exactly what causes diverticulosis. RISK FACTORS  Being older than 50. Your risk for this condition increases with age. Diverticulosis is rare in people younger than 40 years. By age 52, almost everyone has it.  Eating a low-fiber diet.  Being frequently constipated.  Being overweight.  Not getting enough exercise.  Smoking.  Taking over-the-counter pain medicines, like aspirin and ibuprofen. SYMPTOMS  Most people with diverticulosis do not have symptoms. DIAGNOSIS  Because diverticulosis often has  no symptoms, health care providers often discover the condition during an exam for other colon problems. In many cases, a health care provider will diagnose diverticulosis while using a flexible scope to examine the colon (colonoscopy). TREATMENT  If you have never developed an infection related to diverticulosis, you may not need treatment. If you have had an infection before, treatment may include:  Eating more fruits, vegetables, and grains.  Taking  a fiber supplement.  Taking a live bacteria supplement (probiotic).  Taking medicine to relax your colon. HOME CARE INSTRUCTIONS   Drink at least 6-8 glasses of water each day to prevent constipation.  Try not to strain when you have a bowel movement.  Keep all follow-up appointments. If you have had an infection before:  Increase the fiber in your diet as directed by your health care provider or dietitian.  Take a dietary fiber supplement if your health care provider approves.  Only take medicines as directed by your health care provider. SEEK MEDICAL CARE IF:   You have abdominal pain.  You have bloating.  You have cramps.  You have not gone to the bathroom in 3 days. SEEK IMMEDIATE MEDICAL CARE IF:   Your pain gets worse.  Yourbloating becomes very bad.  You have a fever or chills, and your symptoms suddenly get worse.  You begin vomiting.  You have bowel movements that are bloody or black. MAKE SURE YOU:  Understand these instructions.  Will watch your condition.  Will get help right away if you are not doing well or get worse.   This information is not intended to replace advice given to you by your health care provider. Make sure you discuss any questions you have with your health care provider.   Document Released: 04/24/2004 Document Revised: 08/02/2013 Document Reviewed: 06/22/2013 Elsevier Interactive Patient Education [employer]2016 Elsevier Inc.

## 2016-06-24 ENCOUNTER — Encounter (HOSPITAL_COMMUNITY): Payer: Self-pay | Admitting: Internal Medicine

## 2017-04-06 ENCOUNTER — Telehealth: Payer: Self-pay | Admitting: General Practice

## 2017-04-06 ENCOUNTER — Other Ambulatory Visit (HOSPITAL_COMMUNITY): Payer: Self-pay | Admitting: Preventative Medicine

## 2017-04-06 ENCOUNTER — Ambulatory Visit (HOSPITAL_COMMUNITY)
Admission: RE | Admit: 2017-04-06 | Discharge: 2017-04-06 | Disposition: A | Discharge status: Home / Self Care | Payer: Self-pay | Source: Ambulatory Visit | Attending: Preventative Medicine | Admitting: Preventative Medicine

## 2017-04-06 ENCOUNTER — Encounter: Payer: Self-pay | Admitting: Internal Medicine

## 2017-04-06 DIAGNOSIS — R1030 Lower abdominal pain, unspecified: Secondary | ICD-10-CM

## 2017-04-06 DIAGNOSIS — K5732 Diverticulitis of large intestine without perforation or abscess without bleeding: Secondary | ICD-10-CM | POA: Insufficient documentation

## 2017-04-06 MED ORDER — IOPAMIDOL (ISOVUE-300) INJECTION 61%
INTRAVENOUS | Status: AC
  Filled 2017-04-06: qty 30

## 2017-04-06 MED ORDER — IOPAMIDOL (ISOVUE-300) INJECTION 61%
100.0000 mL | Freq: Once | INTRAVENOUS | Status: AC | PRN
  Administered 2017-04-06: 100 mL via INTRAVENOUS

## 2017-04-06 NOTE — Telephone Encounter (Signed)
APPT MADE AND LETTER SENT  °

## 2017-04-06 NOTE — Telephone Encounter (Signed)
Per RMR patient needs a low residue diet faxed to [phone].  Please note the patient needs to be on a clear liquid diet next 24 hours and then advanced to a low residue diet.  Appointment with Dr. Jena Gauss only in a month  Routing to [place] and [place]

## 2017-04-06 NOTE — Telephone Encounter (Signed)
I have faxed a copy of the low residue diet and sent the pt a message in mychart.

## 2017-04-07 ENCOUNTER — Other Ambulatory Visit (HOSPITAL_COMMUNITY): Payer: Self-pay | Admitting: Preventative Medicine

## 2017-05-12 ENCOUNTER — Ambulatory Visit (INDEPENDENT_AMBULATORY_CARE_PROVIDER_SITE_OTHER): Payer: Self-pay | Admitting: Internal Medicine

## 2017-05-12 ENCOUNTER — Encounter: Payer: Self-pay | Admitting: Internal Medicine

## 2017-05-12 VITALS — BP 130/85 | HR 76 | Temp 96.9°F | Ht 66.0 in | Wt 177.2 lb

## 2017-05-12 DIAGNOSIS — K5909 Other constipation: Secondary | ICD-10-CM

## 2017-05-12 DIAGNOSIS — K5732 Diverticulitis of large intestine without perforation or abscess without bleeding: Secondary | ICD-10-CM

## 2017-05-12 DIAGNOSIS — K219 Gastro-esophageal reflux disease without esophagitis: Secondary | ICD-10-CM

## 2017-05-12 NOTE — Patient Instructions (Addendum)
High fiber /low fat diverticulosis diet  Begin Benefiber 1 tablespoon daily for the next 3 weeks then increase to 1 tablespoon twice daily thereafter.  Use MiraLAX one cap full in 8 ounces of water at bedtime as needed for constipation.  Anti-GERD diet information  As discussed, even just a 10-15 pound weight loss will likely help her reflux symptoms  Trial of Dexilant 60 mg daily-samples provided - let me know how that's working for you and we will call in a prescription for a generic agent for long-term use.  Blood for TSH, Chem-12 and CBC  Office visit with me in 6 weeks  Repeat screening colonoscopy in 4 years.

## 2017-05-12 NOTE — Progress Notes (Signed)
Primary Care Physician:  Sharilyn Sites, MD Primary Gastroenterologist:  Dr. Gala Romney  Pre-Procedure History & Physical: HPI:  Joyce Davis is a 51 y.o. female here for evaluation of recent bout of left lower quadrant abdominal pain. Seen at urgent care;  CT demonstrated uncomplicated sigmoid diverticulitis. She was given Cipro and Flagyl. She is much improved. Her baseline is that of chronic constipation. She has seen any blood per rectum. She is frequently  constipated and takes prunes for that problem. Colonoscopy 1 year ago demonstrated diverticulosis (high risk exam due to a positive family history of colon polyps).  She notes she has gained about 30 pounds in the past couple of years. Heartburn symptoms have become worse. Not taking any medications -  previously was on Prilosec years ago. Currently weighs 177 pounds -  at one point she weighed about 220. She does report fatigue all the time. History of hypothyroidism on supplementation.  Past Medical History:  Diagnosis Date  . Hypothyroidism   . Thyroid disease     Past Surgical History:  Procedure Laterality Date  . COLONOSCOPY N/A 06/20/2016   Procedure: COLONOSCOPY;  Surgeon: Daneil Dolin, MD;  Location: [facility];  Service: Endoscopy;  Laterality: N/A;  830  . ENDOMETRIAL ABLATION    . EYE SURGERY Left   . LAPAROSCOPIC SUPRACERVICAL HYSTERECTOMY    . TUBAL LIGATION      Prior to Admission medications   Medication Sig Start Date End Date Taking? Authorizing Provider  ibuprofen (ADVIL,MOTRIN) 200 MG tablet Take 400 mg by mouth every 6 (six) hours as needed for headache or moderate pain.   Yes [provider]  levothyroxine (SYNTHROID, LEVOTHROID) 125 MCG tablet Take 125 mcg by mouth daily. 05/30/16  Yes [provider]  peg 3350 powder (MOVIPREP) 100 g SOLR Take 1 kit (200 g total) by mouth as directed. Patient not taking: Reported on 05/12/2017 06/17/16   Daneil Dolin, MD  phentermine 15 MG  capsule Take 15 mg by mouth every other day.  05/29/16   [provider]    Allergies as of 05/12/2017 - Review Complete 05/12/2017  Allergen Reaction Noted  . Bee venom Anaphylaxis and Swelling 05/28/2016  . Penicillins Hives 05/28/2016    Family History  Problem Relation Age of Onset  . Heart attack Paternal Grandfather   . Alzheimer's disease Paternal Grandmother   . Colon cancer Maternal Grandmother   . Cancer Maternal Grandfather        lung  . COPD Father   . Stroke Mother   . Stroke Brother   . Breast cancer Sister   . Other Sister        mouth stays dry  . Stroke Sister        mini  . Other Sister        legs swell    Social History   Social History  . Marital status: Married    Spouse name: N/A  . Number of children: N/A  . Years of education: N/A   Occupational History  . Not on file.   Social History Main Topics  . Smoking status: Former Smoker    Years: 7.00    Types: Cigarettes  . Smokeless tobacco: Never Used  . Alcohol use Yes     Comment: occ  . Drug use: No  . Sexual activity: Yes    Birth control/ protection: Surgical     Comment: supracervical hyst   Other Topics Concern  .  Not on file   Social History Narrative  . No narrative on file    Review of Systems: See HPI, otherwise negative ROS  Physical Exam: BP 130/85   Pulse 76   Temp (!) 96.9 F (36.1 C) (Oral)   Ht '5\' 6"'  (1.676 m)   Wt 177 lb 3.2 oz (80.4 kg)   BMI 28.60 kg/m  General:   Alert,  Well-developed, well-nourished, pleasant and cooperative in NAD Neck:  Supple; no masses or thyromegaly. No significant cervical adenopathy. Lungs:  Clear throughout to auscultation.   No wheezes, crackles, or rhonchi. No acute distress. Heart:  Regular rate and rhythm; no murmurs, clicks, rubs,  or gallops. Abdomen: Non-distended, normal bowel sounds.  Soft and nontender without appreciable mass or hepatosplenomegaly.  Pulses:  Normal pulses noted. Extremities:  Without  clubbing or edema.  Impression: First bout of uncomplicated sigmoid diverticulitis. She is improved with antibiotics. She is up-to-date on colonoscopy as described above. GERD flare insidiously in the setting of a 30 pound weight gain. No alarm symptoms.  Nonspecific symptoms of fatigue.   Recommendations:  High fiber /low fat diverticulosis diet  Begin Benefiber 1 tablespoon daily for the next 3 weeks then increase to 1 tablespoon twice daily thereafter.  Use MiraLAX one cap full in 8 ounces of water at bedtime as needed for constipation.  Anti-GERD diet information  As discussed, even just a 10-15 pound weight loss will likely help her reflux symptoms  Trial of Dexilant 60 mg daily-samples provided - let me know how that's working for you and we will call in a prescription for a generic agent for long-term use.  Blood for TSH, Chem-12 and CBC  Office visit with me in 6 weeks  Repeat screening colonoscopy in 4 years.           Notice: This dictation was prepared with Dragon dictation along with smaller phrase technology. Any transcriptional errors that result from this process are unintentional and may not be corrected upon review.

## 2017-05-13 LAB — COMPREHENSIVE METABOLIC PANEL
ALK PHOS: 65 IU/L (ref 39–117)
ALT: 16 IU/L (ref 0–32)
AST: 21 IU/L (ref 0–40)
Albumin/Globulin Ratio: 1.9 (ref 1.2–2.2)
Albumin: 4.8 g/dL (ref 3.5–5.5)
BILIRUBIN TOTAL: 0.3 mg/dL (ref 0.0–1.2)
BUN/Creatinine Ratio: 17 (ref 9–23)
BUN: 12 mg/dL (ref 6–24)
CALCIUM: 10.3 mg/dL — AB (ref 8.7–10.2)
CHLORIDE: 101 mmol/L (ref 96–106)
CO2: 27 mmol/L (ref 20–29)
Creatinine, Ser: 0.72 mg/dL (ref 0.57–1.00)
GFR calc Af Amer: 111 mL/min/{1.73_m2} (ref >59)
GFR calc non Af Amer: 97 mL/min/{1.73_m2} (ref >59)
GLUCOSE: 87 mg/dL (ref 65–99)
Globulin, Total: 2.5 g/dL (ref 1.5–4.5)
POTASSIUM: 4.4 mmol/L (ref 3.5–5.2)
SODIUM: 141 mmol/L (ref 134–144)
TOTAL PROTEIN: 7.3 g/dL (ref 6.0–8.5)

## 2017-05-13 LAB — TSH: TSH: 4.63 u[IU]/mL — ABNORMAL HIGH (ref 0.450–4.500)

## 2017-05-13 LAB — CBC WITH DIFFERENTIAL/PLATELET
BASOS ABS: 0.1 10*3/uL (ref 0.0–0.2)
Basos: 1 %
EOS (ABSOLUTE): 0.2 10*3/uL (ref 0.0–0.4)
Eos: 3 %
Hematocrit: 41.4 % (ref 34.0–46.6)
Hemoglobin: 13.9 g/dL (ref 11.1–15.9)
IMMATURE GRANS (ABS): 0 10*3/uL (ref 0.0–0.1)
IMMATURE GRANULOCYTES: 0 %
LYMPHS: 34 %
Lymphocytes Absolute: 1.7 10*3/uL (ref 0.7–3.1)
MCH: 31.7 pg (ref 26.6–33.0)
MCHC: 33.6 g/dL (ref 31.5–35.7)
MCV: 94 fL (ref 79–97)
MONOCYTES: 10 %
Monocytes Absolute: 0.5 10*3/uL (ref 0.1–0.9)
NEUTROS PCT: 52 %
Neutrophils Absolute: 2.6 10*3/uL (ref 1.4–7.0)
PLATELETS: 214 10*3/uL (ref 150–379)
RBC: 4.39 x10E6/uL (ref 3.77–5.28)
RDW: 13.7 % (ref 12.3–15.4)
WBC: 5.1 10*3/uL (ref 3.4–10.8)

## 2017-05-18 ENCOUNTER — Other Ambulatory Visit: Payer: Self-pay | Admitting: Obstetrics and Gynecology

## 2017-05-18 DIAGNOSIS — Z1231 Encounter for screening mammogram for malignant neoplasm of breast: Secondary | ICD-10-CM

## 2017-05-29 ENCOUNTER — Telehealth: Payer: Self-pay

## 2017-05-29 MED ORDER — CIPROFLOXACIN HCL 500 MG PO TABS: 500.0000 mg | ORAL_TABLET | Freq: Two times a day (BID) | ORAL | 0 refills | Status: AC

## 2017-05-29 MED ORDER — METRONIDAZOLE 250 MG PO TABS: 250.0000 mg | ORAL_TABLET | Freq: Three times a day (TID) | ORAL | 0 refills | Status: AC

## 2017-05-29 MED ORDER — METRONIDAZOLE 500 MG PO TABS: 500.0000 mg | ORAL_TABLET | Freq: Three times a day (TID) | ORAL | 0 refills | Status: DC

## 2017-05-29 NOTE — Telephone Encounter (Signed)
RMR called- he had spoken with the pt and she needs cipro 500mg  bid x 10 days and flagyl 500mg  tid x 10 days sent to [facility]. No refills. rx sent.

## 2017-05-29 NOTE — Telephone Encounter (Signed)
RMR called back, pt needs flagyl 250mg  tid x 10 days instead of the 500mg . I have changed rx and sent it in. I also called [facility]- they are closed, left a voicemail that the flagyl 250mg  is the correct dosage.

## 2017-06-08 ENCOUNTER — Ambulatory Visit
Admission: RE | Admit: 2017-06-08 | Discharge: 2017-06-08 | Disposition: A | Discharge status: Home / Self Care | Payer: PRIVATE HEALTH INSURANCE | Source: Ambulatory Visit | Attending: Obstetrics and Gynecology | Admitting: Obstetrics and Gynecology

## 2017-06-08 DIAGNOSIS — Z1231 Encounter for screening mammogram for malignant neoplasm of breast: Secondary | ICD-10-CM

## 2017-06-26 ENCOUNTER — Encounter: Payer: Self-pay | Admitting: Internal Medicine

## 2017-06-26 ENCOUNTER — Ambulatory Visit: Payer: Self-pay | Admitting: Internal Medicine

## 2017-06-26 VITALS — BP 121/77 | HR 82 | Temp 96.7°F | Ht 66.0 in | Wt 184.2 lb

## 2017-06-26 DIAGNOSIS — K219 Gastro-esophageal reflux disease without esophagitis: Secondary | ICD-10-CM

## 2017-06-26 DIAGNOSIS — K5732 Diverticulitis of large intestine without perforation or abscess without bleeding: Secondary | ICD-10-CM

## 2017-06-26 NOTE — Patient Instructions (Addendum)
Continue Benefiber 1 tablespoon twice daily  GERD information provided  Office 6 months and as needed

## 2017-06-26 NOTE — Progress Notes (Signed)
Primary Care Physician:  Sharilyn Sites, MD Primary Gastroenterologist:  Dr. Gala Romney  Pre-Procedure History & Physical: HPI:  Joyce Davis is a 51 y.o. female here for uncomplicated sigmoid diverticulitis. After seeing [place] at last visit, she started having recurrent left lower quadrant, just before getting on vacation. Called in Cipro and Flagyl. She took 2 days' worth felt better. Watched her diet; Pain resolved. She is taking Benefiber 1 tablespoon twice daily. Feels back to baseline. She has gained 7 pounds. TSH and calcium noted be up recently. PCP increased Synthroid. Of note, 2 of her sisters have had parathyroidectomies for hyperparathyroidism.  She tells me she's been very good about watching her diet and has not really had much in way of any reflux symptoms. Has most of the Dexilant samples we provided at home.  Past Medical History:  Diagnosis Date  . Hypothyroidism   . Thyroid disease     Past Surgical History:  Procedure Laterality Date  . COLONOSCOPY N/A 06/20/2016   Performed by Daneil Dolin, MD at [place]    . EYE SURGERY Left   . LAPAROSCOPIC SUPRACERVICAL HYSTERECTOMY    . TUBAL LIGATION      Prior to Admission medications   Medication Sig Start Date End Date Taking? Authorizing Provider  ibuprofen (ADVIL,MOTRIN) 200 MG tablet Take 400 mg by mouth every 6 (six) hours as needed for headache or moderate pain.   Yes [provider]  levothyroxine (SYNTHROID, LEVOTHROID) 125 MCG tablet Take 125 mcg by mouth daily. 05/30/16  Yes [provider]  peg 3350 powder (MOVIPREP) 100 g SOLR Take 1 kit (200 g total) by mouth as directed. Patient not taking: Reported on 05/12/2017 06/17/16   Daneil Dolin, MD  phentermine 15 MG capsule Take 15 mg by mouth every other day.  05/29/16   [provider]    Allergies as of 06/26/2017 - Review Complete 06/26/2017  Allergen Reaction Noted  . Bee venom Anaphylaxis and  Swelling 05/28/2016  . Penicillins Hives 05/28/2016    Family History  Problem Relation Age of Onset  . Heart attack Paternal Grandfather   . Alzheimer's disease Paternal Grandmother   . Colon cancer Maternal Grandmother   . Cancer Maternal Grandfather        lung  . COPD Father   . Stroke Mother   . Stroke Brother   . Breast cancer Sister   . Other Sister        mouth stays dry  . Stroke Sister        mini  . Other Sister        legs swell    Social History   Socioeconomic History  . Marital status: Married    Spouse name: Not on file  . Number of children: Not on file  . Years of education: Not on file  . Highest education level: Not on file  Social Needs  . Financial resource strain: Not on file  . Food insecurity - worry: Not on file  . Food insecurity - inability: Not on file  . Transportation needs - medical: Not on file  . Transportation needs - non-medical: Not on file  Occupational History  . Not on file  Tobacco Use  . Smoking status: Former Smoker    Years: 7.00    Types: Cigarettes  . Smokeless tobacco: Never Used  Substance and Sexual Activity  . Alcohol use: Yes    Comment: occ  .  Drug use: No  . Sexual activity: Yes    Birth control/protection: Surgical    Comment: supracervical hyst  Other Topics Concern  . Not on file  Social History Narrative  . Not on file    Review of Systems: See HPI, otherwise negative ROS  Physical Exam: BP 121/77   Pulse 82   Temp (!) 96.7 F (35.9 C) (Oral)   Ht _0  (1.676 m)   Wt 184 lb 3.2 oz (83.6 kg)   BMI 29.73 kg/m  General:   Alert,  Well-developed, well-nourished, pleasant and cooperative in NAD Skin:  Intact without significant lesions or rashes. Lungs:  Clear throughout to auscultation.   No wheezes, crackles, or rhonchi. No acute distress. Heart:  Regular rate and rhythm; no murmurs, clicks, rubs,  or gallops. Abdomen: Non-distended, normal bowel sounds.  Soft and nontender without  appreciable mass or hepatosplenomegaly.  Pulses:  Normal pulses noted. Extremities:  Without clubbing or edema.  Impression:   Pleasant 51 year old lady with recent uncomplicated sigmoid diverticulitis; may well have had a mild flare recently.  Currently, back to baseline. Recommended daily fiber supplement, particularly,  on days when she is doing well.  If she has another well-documented attack, may need to talk more about elective resection.  GERD symptoms fairly well controlled with diet alone at this time.  Recommendations: Continue Benefiber 1 tablespoon twice daily  GERD information provided  Office 6 months and as needed               Notice: This dictation was prepared with Dragon dictation along with smaller phrase technology. Any transcriptional errors that result from this process are unintentional and may not be corrected upon review.

## 2017-07-15 ENCOUNTER — Other Ambulatory Visit: Payer: Self-pay

## 2017-07-15 ENCOUNTER — Encounter: Payer: Self-pay | Admitting: Adult Health

## 2017-07-15 ENCOUNTER — Ambulatory Visit (INDEPENDENT_AMBULATORY_CARE_PROVIDER_SITE_OTHER): Payer: Self-pay | Admitting: Adult Health

## 2017-07-15 VITALS — BP 114/76 | HR 69 | Resp 18 | Ht 66.0 in | Wt 184.0 lb

## 2017-07-15 DIAGNOSIS — Z1211 Encounter for screening for malignant neoplasm of colon: Secondary | ICD-10-CM | POA: Insufficient documentation

## 2017-07-15 DIAGNOSIS — Z01419 Encounter for gynecological examination (general) (routine) without abnormal findings: Secondary | ICD-10-CM | POA: Insufficient documentation

## 2017-07-15 LAB — HEMOCCULT GUIAC POC 1CARD (OFFICE): FECAL OCCULT BLD: NEGATIVE

## 2017-07-15 NOTE — Progress Notes (Signed)
Patient ID: Joyce Davis, female   DOB: Jan 04, 1966, 51 y.o.   MRN: [id number] History of Present Illness:  Joyce Davis is a 51 year old white female, [facility], [facility], in for well woman gyn exam,she had normal pap with negative HPV 05/28/16.Has had slightly elevated calcium level and TSH and had meds increased and to recheck in several weeks.  [place] [name]  Current Medications, Allergies, Past Medical History, Past Surgical History, Family History and Social History were reviewed in [name] electronic medical record.     Review of Systems:  Patient denies any headaches, hearing loss, fatigue, blurred vision, shortness of breath, chest pain, abdominal pain, problems with bowel movements, urination, or intercourse(occasional pain). No joint pain or mood swings.Has had diverticulitis recent and is having weight gain, hot flashes and night sweats.  Physical Exam:BP 114/76 (BP Location: Right Arm, Patient Position: Sitting, Cuff Size: Normal)   Pulse 69   Resp 18   Ht 5\' 6"  (1.676 m)   Wt 184 lb (83.5 kg)   BMI 29.70 kg/m  General:  Well developed, well nourished, no acute distress Skin:  Warm and dry Neck:  Midline trachea, normal thyroid, good ROM, no lymphadenopathy Lungs; Clear to auscultation bilaterally Breast:  No dominant palpable mass, retraction, or nipple discharge Cardiovascular: Regular rate and rhythm Abdomen:  Soft, non tender, no hepatosplenomegaly Pelvic:  External genitalia is normal in appearance, no lesions.  The vagina is normal in appearance. Urethra has no lesions or masses. The cervix is bulbous.  Uterus is absent. No adnexal masses, +tenderness, LLQ  noted.Bladder is non tender, no masses felt. Rectal: Good sphincter tone, no polyps, or hemorrhoids felt.  Hemoccult negative. Extremities/musculoskeletal:  No swelling or varicosities noted, no clubbing or cyanosis Psych:  No mood changes, alert and cooperative,seems happy PHQ 2 score 0. She declines Estrogen  therapy at this time, change positions with sex  Impression: 1. Encounter for well woman exam with routine gynecological exam   2. Screening for colon cancer       Plan: Physical in 1 year Pap in 2020 Mammogram yearly Labs with PCP Colonoscopy per Dr [name]

## 2017-09-30 ENCOUNTER — Emergency Department (HOSPITAL_COMMUNITY)
Admission: EM | Admit: 2017-09-30 | Discharge: 2017-09-30 | Disposition: A | Discharge status: Home / Self Care | Payer: Self-pay | Attending: Emergency Medicine | Admitting: Emergency Medicine

## 2017-09-30 ENCOUNTER — Encounter (HOSPITAL_COMMUNITY): Payer: Self-pay | Admitting: Emergency Medicine

## 2017-09-30 ENCOUNTER — Other Ambulatory Visit: Payer: Self-pay

## 2017-09-30 ENCOUNTER — Emergency Department (HOSPITAL_COMMUNITY): Payer: Self-pay

## 2017-09-30 DIAGNOSIS — Z87891 Personal history of nicotine dependence: Secondary | ICD-10-CM | POA: Insufficient documentation

## 2017-09-30 DIAGNOSIS — E039 Hypothyroidism, unspecified: Secondary | ICD-10-CM | POA: Insufficient documentation

## 2017-09-30 DIAGNOSIS — N12 Tubulo-interstitial nephritis, not specified as acute or chronic: Secondary | ICD-10-CM | POA: Insufficient documentation

## 2017-09-30 LAB — URINALYSIS, ROUTINE W REFLEX MICROSCOPIC
Bilirubin Urine: NEGATIVE
GLUCOSE, UA: NEGATIVE mg/dL
Ketones, ur: 5 mg/dL — AB
Nitrite: NEGATIVE
PROTEIN: 30 mg/dL — AB
Specific Gravity, Urine: 1.004 — ABNORMAL LOW (ref 1.005–1.030)
pH: 9 — ABNORMAL HIGH (ref 5.0–8.0)

## 2017-09-30 LAB — COMPREHENSIVE METABOLIC PANEL
ALBUMIN: 4.4 g/dL (ref 3.5–5.0)
ALT: 18 U/L (ref 14–54)
AST: 23 U/L (ref 15–41)
Alkaline Phosphatase: 66 U/L (ref 38–126)
Anion gap: 11 (ref 5–15)
BUN: 15 mg/dL (ref 6–20)
CHLORIDE: 103 mmol/L (ref 101–111)
CO2: 22 mmol/L (ref 22–32)
CREATININE: 0.78 mg/dL (ref 0.44–1.00)
Calcium: 10.1 mg/dL (ref 8.9–10.3)
GFR calc non Af Amer: >60 mL/min (ref >60)
Glucose, Bld: 98 mg/dL (ref 65–99)
Potassium: 3.4 mmol/L — ABNORMAL LOW (ref 3.5–5.1)
SODIUM: 136 mmol/L (ref 135–145)
Total Bilirubin: 0.7 mg/dL (ref 0.3–1.2)
Total Protein: 8 g/dL (ref 6.5–8.1)

## 2017-09-30 LAB — CBC
HCT: 42.5 % (ref 36.0–46.0)
Hemoglobin: 13.8 g/dL (ref 12.0–15.0)
MCH: 29.5 pg (ref 26.0–34.0)
MCHC: 32.5 g/dL (ref 30.0–36.0)
MCV: 90.8 fL (ref 78.0–100.0)
PLATELETS: 213 10*3/uL (ref 150–400)
RBC: 4.68 MIL/uL (ref 3.87–5.11)
RDW: 13.1 % (ref 11.5–15.5)
WBC: 8.9 10*3/uL (ref 4.0–10.5)

## 2017-09-30 LAB — LIPASE, BLOOD: LIPASE: 29 U/L (ref 11–51)

## 2017-09-30 MED ORDER — METRONIDAZOLE IN NACL 5-0.79 MG/ML-% IV SOLN
500.0000 mg | Freq: Once | INTRAVENOUS | Status: DC
  Filled 2017-09-30: qty 100

## 2017-09-30 MED ORDER — SODIUM CHLORIDE 0.9 % IV BOLUS (SEPSIS)
1000.0000 mL | Freq: Once | INTRAVENOUS | Status: AC
  Administered 2017-09-30: 1000 mL via INTRAVENOUS

## 2017-09-30 MED ORDER — OXYCODONE-ACETAMINOPHEN 5-325 MG PO TABS: 1.0000 | ORAL_TABLET | Freq: Three times a day (TID) | ORAL | 0 refills | Status: DC | PRN

## 2017-09-30 MED ORDER — DOCUSATE SODIUM 100 MG PO CAPS: 100.0000 mg | ORAL_CAPSULE | Freq: Two times a day (BID) | ORAL | 0 refills | Status: DC

## 2017-09-30 MED ORDER — IOPAMIDOL (ISOVUE-300) INJECTION 61%
100.0000 mL | Freq: Once | INTRAVENOUS | Status: AC | PRN
  Administered 2017-09-30: 100 mL via INTRAVENOUS

## 2017-09-30 MED ORDER — ONDANSETRON HCL 4 MG PO TABS: 4.0000 mg | ORAL_TABLET | Freq: Three times a day (TID) | ORAL | 0 refills | Status: DC | PRN

## 2017-09-30 MED ORDER — HYDROMORPHONE HCL 1 MG/ML IJ SOLN
0.5000 mg | Freq: Once | INTRAMUSCULAR | Status: AC
  Administered 2017-09-30: 0.5 mg via INTRAVENOUS
  Filled 2017-09-30: qty 1

## 2017-09-30 MED ORDER — DIPHENHYDRAMINE HCL 50 MG/ML IJ SOLN
25.0000 mg | Freq: Once | INTRAMUSCULAR | Status: AC | PRN
  Administered 2017-09-30: 25 mg via INTRAVENOUS
  Filled 2017-09-30: qty 1

## 2017-09-30 MED ORDER — CIPROFLOXACIN IN D5W 400 MG/200ML IV SOLN
400.0000 mg | Freq: Once | INTRAVENOUS | Status: AC
  Administered 2017-09-30: 400 mg via INTRAVENOUS
  Filled 2017-09-30: qty 200

## 2017-09-30 MED ORDER — OXYCODONE-ACETAMINOPHEN 5-325 MG PO TABS: 1.0000 | ORAL_TABLET | ORAL | 0 refills | Status: DC | PRN

## 2017-09-30 NOTE — ED Provider Notes (Signed)
Emergency Department Provider Note   I have reviewed the triage vital signs and the nursing notes.   HISTORY  Chief Complaint Abdominal Pain   HPI Joyce Davis is a 52 y.o. female with a history of diverticulosis who presents to the emergency department today with left-sided abdominal and back pain similar to previous episodes of diverticulitis however seems to be much worse in severity.  Patient states she started having a comfortable feeling around 3:00 but then at 530 patient started having more severe pain.  Has felt constipated as well.  No fevers.  No chills or diarrhea.  No vomiting.  No rashes.  She denies that she has some blood in her urine. Talked to her GI doctor that told her to come here for evaluation.  No other associated or modifying symptoms.    Past Medical History:  Diagnosis Date  . Diverticulitis   . Hypothyroidism   . Thyroid disease     Patient Active Problem List   Diagnosis Date Noted  . Screening for colon cancer 07/15/2017  . Encounter for well woman exam with routine gynecological exam 07/15/2017    Past Surgical History:  Procedure Laterality Date  . COLONOSCOPY N/A 06/20/2016   Procedure: COLONOSCOPY;  Surgeon: Corbin Adeobert M Rourk, MD;  Location: [facility];  Service: Endoscopy;  Laterality: N/A;  830  . ENDOMETRIAL ABLATION    . EYE SURGERY Left   . LAPAROSCOPIC SUPRACERVICAL HYSTERECTOMY    . TUBAL LIGATION      Current Outpatient Rx  . Order #: [id number] Class: Historical Med  . Order #: [id number] Class: Historical Med  . Order #: [id number] Class: Historical Med  . Order #: [id number] Class: Historical Med  . Order #: [id number] Class: Historical Med  . Order #: [id number] Class: Historical Med  . Order #: [id number] Class: Historical Med  . Order #: [id number] Class: Print  . Order #: [id number] Class: Print  . Order #: [id number] Class: Print  . Order #: [id number] Class: Print  . Order #: [id number] Class: Print    Allergies Bee venom and  Penicillins  Family History  Problem Relation Age of Onset  . Heart attack Paternal Grandfather   . Alzheimer's disease Paternal Grandmother   . Colon cancer Maternal Grandmother   . Cancer Maternal Grandfather        lung  . COPD Father   . Stroke Mother   . Stroke Brother   . Breast cancer Sister   . Other Sister        mouth stays dry  . Stroke Sister        mini  . Other Sister        legs swell    Social History Social History   Tobacco Use  . Smoking status: Former Smoker    Years: 7.00    Types: Cigarettes  . Smokeless tobacco: Never Used  Substance Use Topics  . Alcohol use: Yes    Comment: occ  . Drug use: No    Review of Systems  All other systems negative except as documented in the HPI. All pertinent positives and negatives as reviewed in the HPI. ____________________________________________   PHYSICAL EXAM:  VITAL SIGNS: ED Triage Vitals  Enc Vitals Group     BP 09/30/17 2013 (!) 143/117     Pulse Rate 09/30/17 2013 (!) 101     Resp 09/30/17 2013 (!) 22     Temp 09/30/17 2013 97.6 F (36.4 C)     Temp Source 09/30/17 2013 Oral  SpO2 09/30/17 2013 97 %     Weight 09/30/17 2013 168 lb (76.2 kg)    Constitutional: Alert and oriented. Well appearing and in no acute distress. Eyes: Conjunctivae are normal. PERRL. EOMI. Head: Atraumatic. Nose: No congestion/rhinnorhea. Mouth/Throat: Mucous membranes are moist.  Oropharynx non-erythematous. Neck: No stridor.  No meningeal signs.   Cardiovascular: Normal rate, regular rhythm. Good peripheral circulation. Grossly normal heart sounds.   Respiratory: Normal respiratory effort.  No retractions. Lungs CTAB. Gastrointestinal: Soft and ttp in suprapubic area and LLQ. No rash. No distention.  Musculoskeletal: No lower extremity tenderness nor edema. No gross deformities of extremities. Neurologic:  Normal speech and language. No gross focal neurologic deficits are appreciated.  Skin:  Skin is warm,  dry and intact. No rash noted.  ____________________________________________   LABS (all labs ordered are listed, but only abnormal results are displayed)  Labs Reviewed  COMPREHENSIVE METABOLIC PANEL - Abnormal; Notable for the following components:      Result Value   Potassium 3.4 (*)    All other components within normal limits  URINALYSIS, ROUTINE W REFLEX MICROSCOPIC - Abnormal; Notable for the following components:   Color, Urine STRAW (*)    Specific Gravity, Urine 1.004 (*)    pH 9.0 (*)    Hgb urine dipstick LARGE (*)    Ketones, ur 5 (*)    Protein, ur 30 (*)    Leukocytes, UA LARGE (*)    Bacteria, UA RARE (*)    Squamous Epithelial / LPF 0-5 (*)    All other components within normal limits  LIPASE, BLOOD  CBC   ____________________________________________   RADIOLOGY  Ct Abdomen Pelvis W Contrast  Result Date: 09/30/2017 CLINICAL DATA:  Abdominal pain radiating to the back EXAM: CT ABDOMEN AND PELVIS WITH CONTRAST TECHNIQUE: Multidetector CT imaging of the abdomen and pelvis was performed using the standard protocol following bolus administration of intravenous contrast. CONTRAST:  ISOVUE-300 IOPAMIDOL (ISOVUE-300) INJECTION 61% COMPARISON:  CT 04/06/2017 FINDINGS: Lower chest: Lung bases demonstrate no acute consolidation or pleural effusion. The heart size is within normal limits. Hepatobiliary: No focal liver abnormality is seen. No gallstones, gallbladder wall thickening, or biliary dilatation. Pancreas: Unremarkable. No pancreatic ductal dilatation or surrounding inflammatory changes. Spleen: Normal in size without focal abnormality. Adrenals/Urinary Tract: Adrenal glands are within normal limits. Prominent left extrarenal pelvis with mild urothelial enhancement. Mild soft tissue stranding at the left renal pelvis hilus with tiny fluid along the lower pole of the left kidney. No ureteral stones seen. The bladder is unremarkable Stomach/Bowel: Stomach is within  normal limits. Appendix appears normal. No evidence of bowel wall thickening, distention, or inflammatory changes. Sigmoid colon diverticular disease without acute inflammation Vascular/Lymphatic: Mild aortic atherosclerosis. No aneurysmal dilatation. No significantly enlarged lymph nodes. Reproductive: No adnexal masses.  Status post hysterectomy. Other: Negative for free air or free fluid Musculoskeletal: No acute or significant osseous findings. IMPRESSION: 1. Mild urothelial enhancement of left extrarenal pelvis and the ureter with subtle soft tissue stranding/edema at the renal hilus with small fluid at the lower pole of left kidney, query pyelonephritis. Suggest correlation with urinalysis. 2. Sigmoid colon diverticular disease without acute inflammation Electronically Signed   By: Jasmine Pang M.D.   On: 09/30/2017 22:28    ____________________________________________   PROCEDURES  Procedure(s) performed:   Procedures   ____________________________________________   INITIAL IMPRESSION / ASSESSMENT AND PLAN / ED COURSE  Will eval for diverticulitis or complication of. Also possibly kidney stone vs UTI.  Pyelo. Pain controlled. VS WNL. Stable for dc. Already has cipro.   Pertinent labs & imaging results that were available during my care of the patient were reviewed by me and considered in my medical decision making (see chart for details).  ____________________________________________  FINAL CLINICAL IMPRESSION(S) / ED DIAGNOSES  Final diagnoses:  Pyelonephritis     MEDICATIONS GIVEN DURING THIS VISIT:  Medications  metroNIDAZOLE (FLAGYL) IVPB 500 mg (500 mg Intravenous Not Given 09/30/17 2115)  sodium chloride 0.9 % bolus 1,000 mL (0 mLs Intravenous Stopped 09/30/17 2328)  HYDROmorphone (DILAUDID) injection 0.5 mg (0.5 mg Intravenous Given 09/30/17 2145)  diphenhydrAMINE (BENADRYL) injection 25 mg (25 mg Intravenous Given 09/30/17 2144)  sodium chloride 0.9 % bolus 1,000  mL (0 mLs Intravenous Stopped 09/30/17 2328)  ciprofloxacin (CIPRO) IVPB 400 mg (0 mg Intravenous Stopped 09/30/17 2323)  iopamidol (ISOVUE-300) 61 % injection 100 mL (100 mLs Intravenous Contrast Given 09/30/17 2211)     NEW OUTPATIENT MEDICATIONS STARTED DURING THIS VISIT:  Discharge Medication List as of 09/30/2017 11:23 PM    START taking these medications   Details  docusate sodium (COLACE) 100 MG capsule Take 1 capsule (100 mg total) by mouth every 12 (twelve) hours. While on pain medications., Starting Wed 09/30/2017, Print    !! ondansetron (ZOFRAN) 4 MG tablet Take 1 tablet (4 mg total) by mouth every 8 (eight) hours as needed for nausea or vomiting., Starting Wed 09/30/2017, Print    !! ondansetron (ZOFRAN) 4 MG tablet Take 1 tablet (4 mg total) by mouth every 8 (eight) hours as needed for nausea or vomiting., Starting Wed 09/30/2017, Print    !! oxyCODONE-acetaminophen (PERCOCET) 5-325 MG tablet Take 1 tablet by mouth every 8 (eight) hours as needed for severe pain., Starting Wed 09/30/2017, Print    !! oxyCODONE-acetaminophen (PERCOCET/ROXICET) 5-325 MG tablet Take 1 tablet by mouth every 4 (four) hours as needed for severe pain., Starting Wed 09/30/2017, Print     !! - Potential duplicate medications found. Please discuss with provider.      Note:  This note was prepared with assistance of Dragon voice recognition software. Occasional wrong-word or sound-a-like substitutions may have occurred due to the inherent limitations of voice recognition software.   Marily Memos, MD 09/30/17 [phone]

## 2017-09-30 NOTE — ED Triage Notes (Signed)
Pt c/o abd pain that radiates to the back. Pt was diagnosed with diverticulitis and she was placed on antibiotics.

## 2017-09-30 NOTE — ED Notes (Signed)
Patient ambulated to restroom to obtain urine specimen. On the way back to room patient states that she felt dizzy. Patient resting at this time.

## 2017-10-01 MED FILL — Ondansetron HCl Tab 4 MG: ORAL | Qty: 4 | Status: AC

## 2017-10-01 MED FILL — Oxycodone w/ Acetaminophen Tab 5-325 MG: ORAL | Qty: 6 | Status: AC

## 2017-11-11 ENCOUNTER — Encounter: Payer: Self-pay | Admitting: Internal Medicine

## 2018-06-16 ENCOUNTER — Other Ambulatory Visit: Payer: Self-pay | Admitting: Obstetrics and Gynecology

## 2018-06-16 DIAGNOSIS — Z1231 Encounter for screening mammogram for malignant neoplasm of breast: Secondary | ICD-10-CM

## 2018-07-29 ENCOUNTER — Ambulatory Visit
Admission: RE | Admit: 2018-07-29 | Discharge: 2018-07-29 | Disposition: A | Discharge status: Home / Self Care | Payer: No Typology Code available for payment source | Source: Ambulatory Visit | Attending: Obstetrics and Gynecology | Admitting: Obstetrics and Gynecology

## 2018-07-29 DIAGNOSIS — Z1231 Encounter for screening mammogram for malignant neoplasm of breast: Secondary | ICD-10-CM

## 2018-10-20 ENCOUNTER — Other Ambulatory Visit: Payer: Self-pay

## 2018-10-20 ENCOUNTER — Telehealth: Payer: Self-pay

## 2018-10-20 MED ORDER — METRONIDAZOLE 500 MG PO TABS: 500.0000 mg | ORAL_TABLET | Freq: Three times a day (TID) | ORAL | 0 refills | Status: AC

## 2018-10-20 MED ORDER — CIPROFLOXACIN HCL 500 MG PO TABS: 500.0000 mg | ORAL_TABLET | Freq: Two times a day (BID) | ORAL | 0 refills | Status: AC

## 2018-10-20 NOTE — Telephone Encounter (Signed)
Escribed Cipro 500mg  bid x 7 days #14 0 rfs, Metronidazole 500 mg tid x 7 days #21 with 0 rfs per RMR for acute Diverticulitis.

## 2018-10-25 ENCOUNTER — Telehealth: Payer: Self-pay | Admitting: Internal Medicine

## 2018-10-25 NOTE — Telephone Encounter (Signed)
Patient's husband called me on 10/20/2018 (on my cell phone in [place] when I was on vacation).  He reported recurrence of bilateral lower suprapubic abdominal pain identical to patient's previous bouts of diverticulitis.  No fever.  No nausea or vomiting.  Wants to be treated before symptoms get worse.  Given well known clinical history of diverticulitis, I recommended that she go on Cipro 500 mg twice daily x7 days and Flagyl 500 mg 3 times daily x7 days.  Clear liquid diet for the subsequent couple of days.  If no better call back or go to the ED.  Patient will be making a follow-up appointment in the near future.

## 2018-10-25 NOTE — Telephone Encounter (Signed)
Noted  You're welcome 

## 2019-06-04 IMAGING — CT CT ABD-PELV W/ CM
2 of 5 series · 16 of 46 positions shown, 18 images · IV contrast (iopamidol)
Comparison: None.

CLINICAL DATA: Lower abdominal pain and nausea.

EXAM:
CT ABDOMEN AND PELVIS WITH CONTRAST
TECHNIQUE: Multidetector CT imaging of the abdomen and pelvis was performed
using the standard protocol following bolus administration of
intravenous contrast.
CONTRAST:  100mL IJL7P4-RPP IOPAMIDOL (IJL7P4-RPP) INJECTION 61%

[Series 2: axial st · axial · 0.92mm/px · z∈[-763,-348]mm · 13 of 93 slices shown, 15 images]
[im 5/93  soft-tissue]
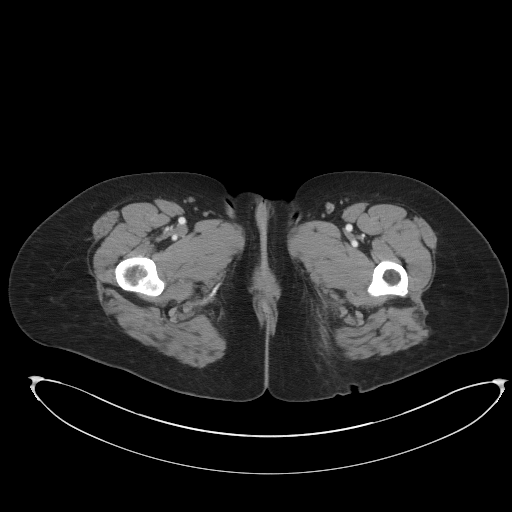
[im 5/93  bone]
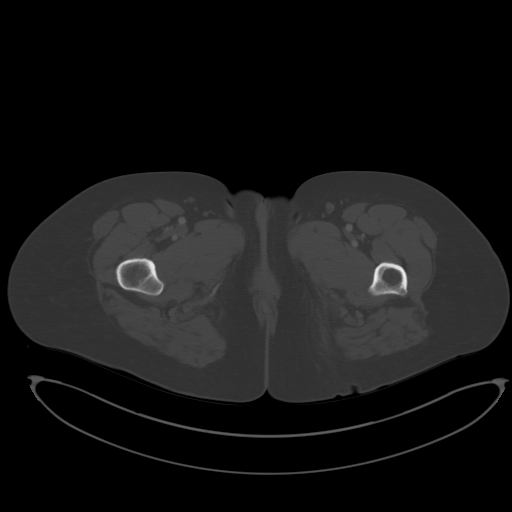
[im 15/93  soft-tissue]
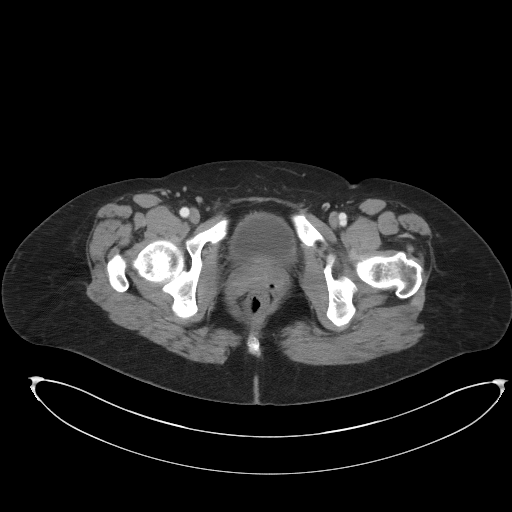
[im 20/93  soft-tissue]
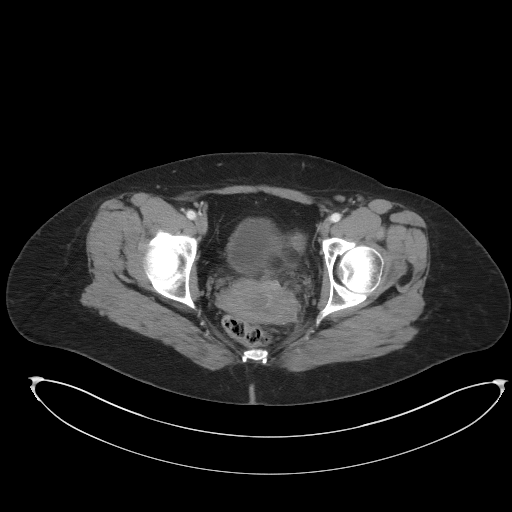
[im 25/93  soft-tissue]
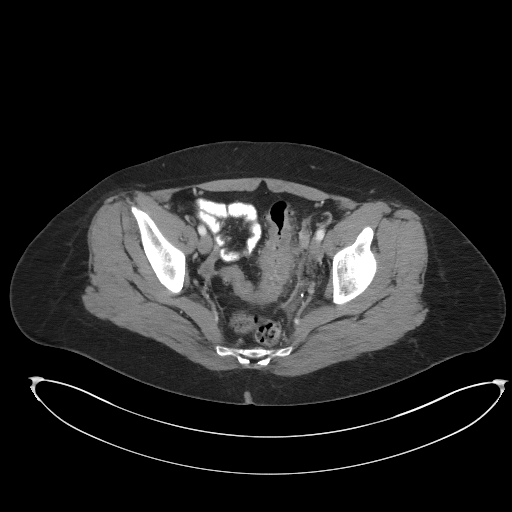
[im 34/93  soft-tissue]
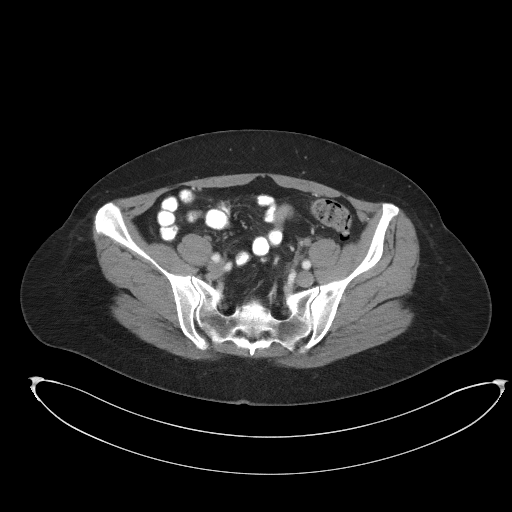
[im 39/93  soft-tissue]
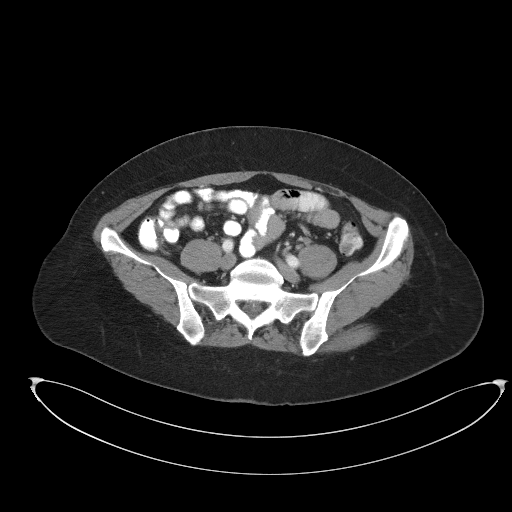
[im 49/93  soft-tissue]
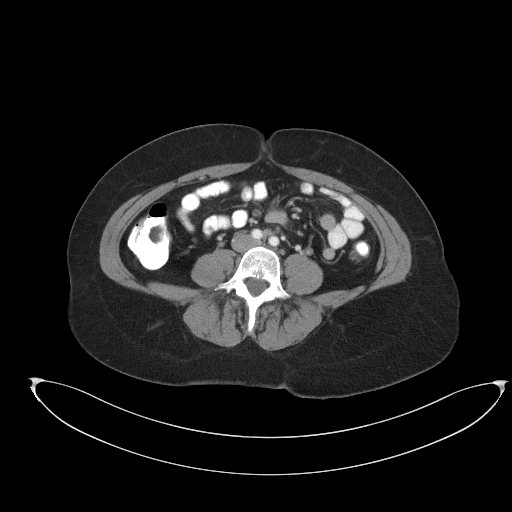
[im 54/93  soft-tissue]
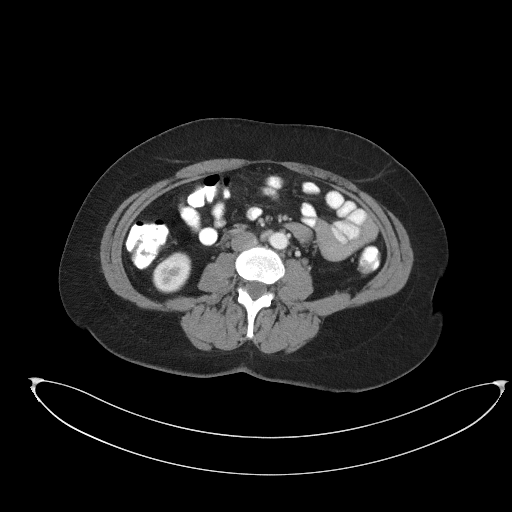
[im 59/93  soft-tissue]
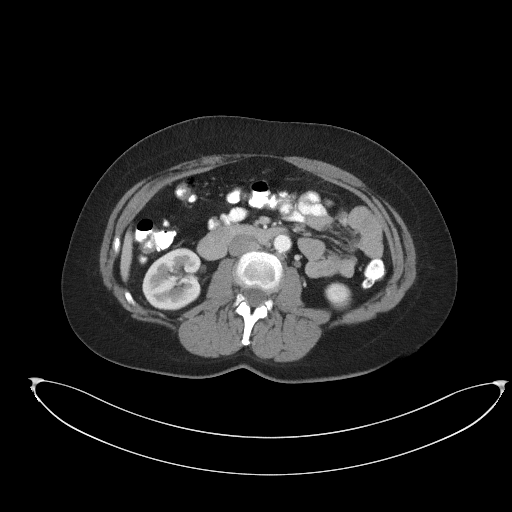
[im 59/93  bone]
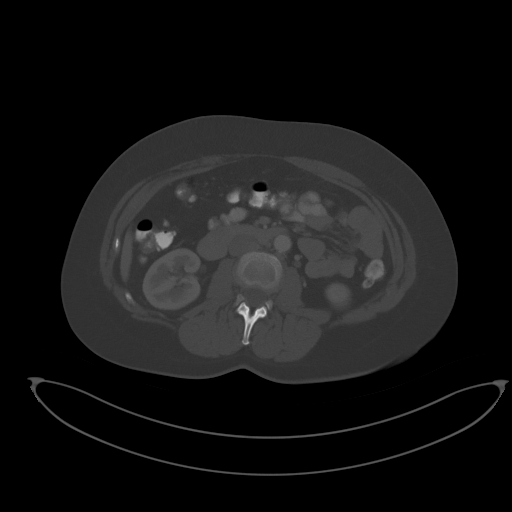
[im 68/93  soft-tissue]
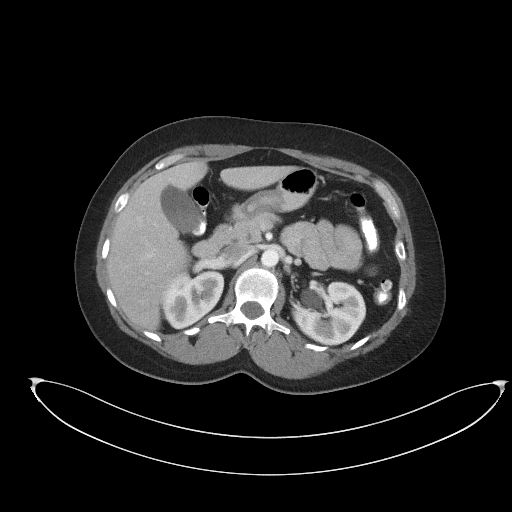
[im 73/93  soft-tissue]
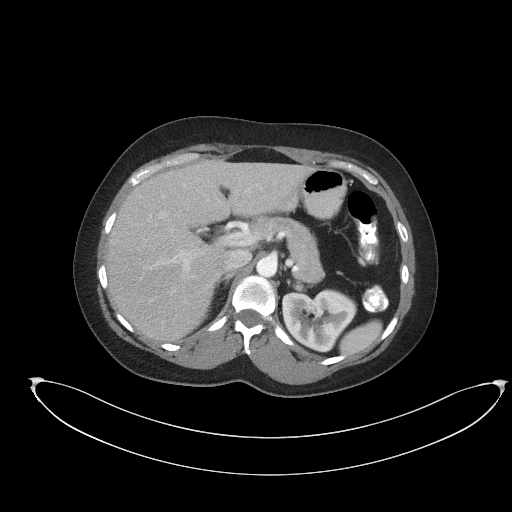
[im 78/93  soft-tissue]
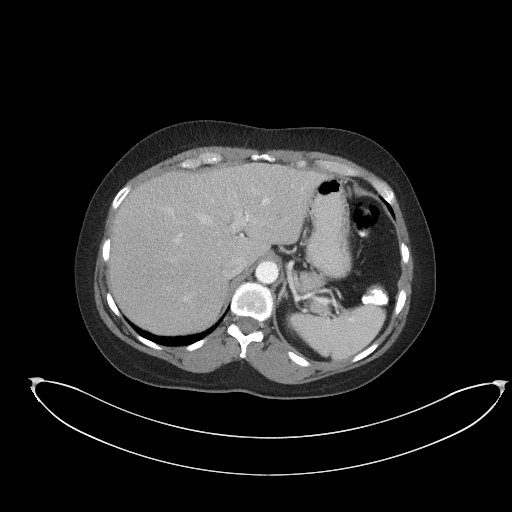
[im 88/93  soft-tissue]
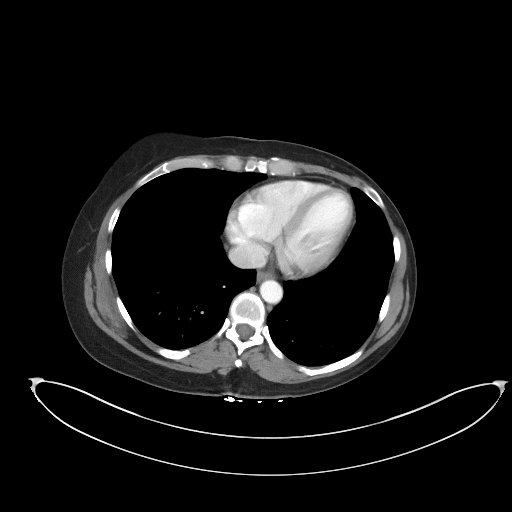

[Series 5: coronal st · coronal · 0.87mm/px · 3 of 92 slices shown]
[im 31/92  soft-tissue]
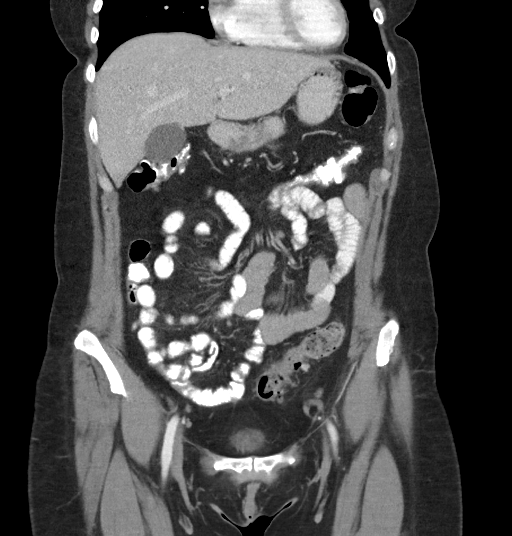
[im 41/92  soft-tissue]
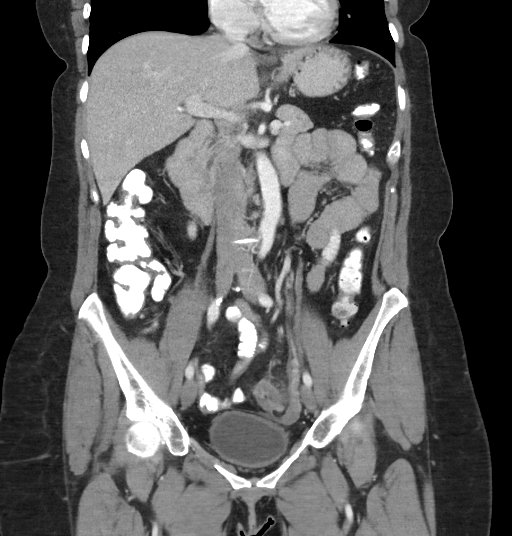
[im 51/92  soft-tissue]
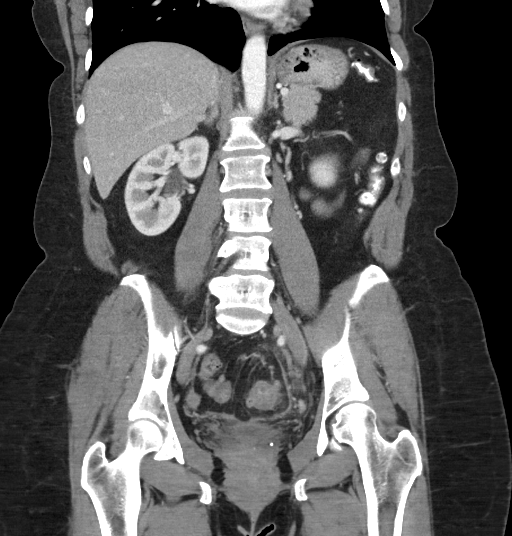

[16 of 46 positions shown; findings below may reference images not displayed]

FINDINGS: Lower chest: No acute abnormality.

Hepatobiliary: No focal liver abnormality is seen. Focal fat along
the falciform ligament. No gallstones, gallbladder wall thickening,
or biliary dilatation.

Pancreas: Unremarkable. No pancreatic ductal dilatation or
surrounding inflammatory changes.

Spleen: Normal in size without focal abnormality.

Adrenals/Urinary Tract: Adrenal glands are unremarkable. Kidneys are
normal, without renal calculi, focal lesion, or hydronephrosis.
Bladder is unremarkable.

Stomach/Bowel: Left-sided colonic diverticulosis with focal wall
thickening of the mid sigmoid colon with surrounding pericolonic
inflammatory fat stranding, consistent with diverticulitis. No
evidence of microperforation or drainable fluid collection. Normal
appendix. The stomach and small bowel are unremarkable. No bowel
obstruction.

Vascular/Lymphatic: No significant vascular findings are present. No
enlarged abdominal or pelvic lymph nodes.

Reproductive: Status post hysterectomy. No adnexal masses. Nabothian
cyst in the cervix.

Other: Small fat containing umbilical hernia. Trace fluid in the
pelvis.

Musculoskeletal: No acute or significant osseous findings.
IMPRESSION: 1. Acute uncomplicated sigmoid diverticulitis. No evidence of
microperforation or abscess.
These results will be called to the ordering clinician or
representative by the Radiologist Assistant, and communication
documented in the PACS or zVision Dashboard.

## 2019-08-29 ENCOUNTER — Other Ambulatory Visit: Payer: Self-pay

## 2019-09-12 ENCOUNTER — Ambulatory Visit: Payer: No Typology Code available for payment source | Attending: Internal Medicine

## 2019-09-12 ENCOUNTER — Other Ambulatory Visit: Payer: Self-pay

## 2019-09-12 DIAGNOSIS — U071 COVID-19: Secondary | ICD-10-CM | POA: Insufficient documentation

## 2019-09-12 DIAGNOSIS — Z20822 Contact with and (suspected) exposure to covid-19: Secondary | ICD-10-CM

## 2019-09-13 ENCOUNTER — Telehealth: Payer: Self-pay | Admitting: *Deleted

## 2019-09-13 LAB — NOVEL CORONAVIRUS, NAA: SARS-CoV-2, NAA: DETECTED — AB

## 2019-09-13 NOTE — Telephone Encounter (Signed)
Pt called for result of COVID test from 09/12/19; pt informed results not ready, and the turn around time is based on the number of tests to be completed; explained to pt results are available first in MyChart, and she will get a call regarding results; pt encouraged to answer all calls; she verbalized understanding.

## 2019-09-26 ENCOUNTER — Other Ambulatory Visit: Payer: Self-pay | Admitting: Obstetrics and Gynecology

## 2019-09-26 DIAGNOSIS — Z1231 Encounter for screening mammogram for malignant neoplasm of breast: Secondary | ICD-10-CM

## 2019-09-27 ENCOUNTER — Other Ambulatory Visit: Payer: Self-pay

## 2019-09-27 ENCOUNTER — Ambulatory Visit
Admission: RE | Admit: 2019-09-27 | Discharge: 2019-09-27 | Disposition: A | Discharge status: Home / Self Care | Payer: No Typology Code available for payment source | Source: Ambulatory Visit

## 2019-09-27 DIAGNOSIS — Z1231 Encounter for screening mammogram for malignant neoplasm of breast: Secondary | ICD-10-CM

## 2020-06-03 ENCOUNTER — Telehealth: Payer: Self-pay | Admitting: Internal Medicine

## 2020-06-03 NOTE — Telephone Encounter (Signed)
Patient called me.  [place] out of state.  Left lower quadrant pain for the past 2 weeks worse over the past 2 days.  Temperature 100.6 today.  No nausea or vomiting.  No dysuria or increased frequency.  History of multiple bouts of diverticulitis previously.  Patient states symptoms very similar to her prior bouts of diverticulitis and different from the single bout of pyelonephritis she had previously. I have called in Cipro 500 mg twice daily x10 days and metronidazole 500 mg 3 times daily x10 days no refills to [facility].  Patient is call if she is not improved in the next 24 to 48 hours. Low residue diet for the next couple of days till abdominal pain subsides.  Needs follow-up visit in the office in 1 to 2 weeks.

## 2020-06-04 NOTE — Telephone Encounter (Signed)
Spoke with pt. Pt has been scheduled with Dr. Jena Gauss on 06/19/20 @ 8:00 AM.

## 2020-06-19 ENCOUNTER — Encounter: Payer: Self-pay | Admitting: Internal Medicine

## 2020-06-19 ENCOUNTER — Other Ambulatory Visit: Payer: Self-pay

## 2020-06-19 ENCOUNTER — Ambulatory Visit (INDEPENDENT_AMBULATORY_CARE_PROVIDER_SITE_OTHER): Payer: Self-pay | Admitting: Internal Medicine

## 2020-06-19 VITALS — BP 112/76 | HR 76 | Temp 97.1°F | Ht 66.0 in | Wt 189.0 lb

## 2020-06-19 DIAGNOSIS — K219 Gastro-esophageal reflux disease without esophagitis: Secondary | ICD-10-CM

## 2020-06-19 DIAGNOSIS — R1032 Left lower quadrant pain: Secondary | ICD-10-CM

## 2020-06-19 NOTE — Progress Notes (Signed)
Primary Care Physician:  Assunta Found, MD Primary Gastroenterologist:  Dr. Jena Gauss  Pre-Procedure History & Physical: HPI:  Joyce Davis is a 54 y.o. female here for follow-up left lower quadrant abdominal pain/history of recurrent diverticulitis.  Patient called in when she was [place] of [place] a couple of weeks ago with typical left lower quadrant abdominal pain and fever.  Treated empirically for diverticulitis by me - Cipro and Flagyl x10 days.  Course completed last week.  Abdominal pain significantly improved - may still have some trying to have a bowel movement and sometimes when eating;  no fever and in between eating and BMs - no pain.  Approximately 5 episodes of diverticulitis over the past 5 years.  One documented episode of diverticulitis on 2017 CT.  1 bout of left lower quadrant abdominal pain found to be a kidney stone and pyelonephritis.  Takes Benefiber 2 tablespoons daily,  takes MiraLAX 1 capful in the morning.  Does have a bowel movement daily but does not feel that she completely evacuates.  Previously took MiraLAX twice daily with good results.  She does try to wean herself down to once daily.  She has not had any bleeding.  Positive family history of colon polyps -  negative colonoscopy 2017; due for high risk screening 2022.  Reflux well controlled on diet.  Takes omeprazole on the order 5 times monthly when she experiences dietary indiscretion.  She takes the omeprazole prophylactically when she knows she is going to overindulge.  No dysphagia.  Upper GI tract evaluation with EGD many, many years ago elsewhere per patient.  No Barrett's or any need for follow-up reportedly recommended.  This was done at a time when she was a significantly overweight.  Past Medical History:  Diagnosis Date  . Diverticulitis   . Hypothyroidism   . Thyroid disease     Past Surgical History:  Procedure Laterality Date  . COLONOSCOPY N/A 06/20/2016   Procedure: COLONOSCOPY;  Surgeon:  Corbin Ade, MD;  Location: [facility];  Service: Endoscopy;  Laterality: N/A;  830  . ENDOMETRIAL ABLATION    . EYE SURGERY Left   . LAPAROSCOPIC SUPRACERVICAL HYSTERECTOMY    . TUBAL LIGATION      Prior to Admission medications   Medication Sig Start Date End Date Taking? Authorizing Provider  hydrochlorothiazide (HYDRODIURIL) 25 MG tablet Take 25 mg by mouth daily.   Yes [provider]  levothyroxine (SYNTHROID) 112 MCG tablet Take 112 mcg by mouth daily.  07/30/17  Yes [provider]  liothyronine (CYTOMEL) 5 MCG tablet Take 5 mcg by mouth daily.   Yes [provider]  omeprazole (PRILOSEC) 20 MG capsule Take 20 mg by mouth as needed.   Yes [provider]  polyethylene glycol powder (GLYCOLAX/MIRALAX) powder Take 17 g by mouth daily.    Yes [provider]  PROGESTERONE PO Take by mouth. 150mg  daily   Yes [provider]  Wheat Dextrin (BENEFIBER PO) Take by mouth daily.    Yes [provider]    Allergies as of 06/19/2020 - Review Complete 06/19/2020  Allergen Reaction Noted  . Bee venom Anaphylaxis and Swelling 05/28/2016  . Penicillins Hives 05/28/2016    Family History  Problem Relation Age of Onset  . Heart attack Paternal Grandfather   . Alzheimer's disease Paternal Grandmother   . Colon cancer Maternal Grandmother   . Cancer Maternal Grandfather        lung  . COPD  Father   . Stroke Mother   . Stroke Brother   . Breast cancer Sister   . Other Sister        mouth stays dry  . Stroke Sister        mini  . Other Sister        legs swell  . Breast cancer Maternal Aunt   . Breast cancer Maternal Aunt     Social History   Socioeconomic History  . Marital status: Married    Spouse name: Not on file  . Number of children: Not on file  . Years of education: Not on file  . Highest education level: Not on file  Occupational History  . Not on file  Tobacco Use  . Smoking status: Former  Smoker    Years: 7.00    Types: Cigarettes  . Smokeless tobacco: Never Used  Substance and Sexual Activity  . Alcohol use: Yes    Comment: occ  . Drug use: No  . Sexual activity: Yes    Birth control/protection: Surgical    Comment: supracervical hyst  Other Topics Concern  . Not on file  Social History Narrative  . Not on file   Social Determinants of Health   Financial Resource Strain:   . Difficulty of Paying Living Expenses: Not on file  Food Insecurity:   . Worried About Programme researcher, broadcasting/film/video in the Last Year: Not on file  . Ran Out of Food in the Last Year: Not on file  Transportation Needs:   . Lack of Transportation (Medical): Not on file  . Lack of Transportation (Non-Medical): Not on file  Physical Activity:   . Days of Exercise per Week: Not on file  . Minutes of Exercise per Session: Not on file  Stress:   . Feeling of Stress : Not on file  Social Connections:   . Frequency of Communication with Friends and Family: Not on file  . Frequency of Social Gatherings with Friends and Family: Not on file  . Attends Religious Services: Not on file  . Active Member of Clubs or Organizations: Not on file  . Attends Banker Meetings: Not on file  . Marital Status: Not on file  Intimate Partner Violence:   . Fear of Current or Ex-Partner: Not on file  . Emotionally Abused: Not on file  . Physically Abused: Not on file  . Sexually Abused: Not on file    Review of Systems: See HPI, otherwise negative ROS  Physical Exam: BP 112/76   Pulse 76   Temp (!) 97.1 F (36.2 C)   Ht 5\' 6"  (1.676 m)   Wt 189 lb (85.7 kg)   BMI 30.51 kg/m  General:   Alert,  Well-developed, well-nourished, pleasant and cooperative in NAD Neck:  Supple; no masses or thyromegaly. No significant cervical adenopathy. Lungs:  Clear throughout to auscultation.   No wheezes, crackles, or rhonchi. No acute distress. Heart:  Regular rate and rhythm; no murmurs, clicks, rubs,  or  gallops. Abdomen: Nondistended.  Positive bowel sounds.  Soft and nontender without appreciable mass organomegaly  pulses:  Normal pulses noted. Extremities:  Without clubbing or edema. Rectal: No external lesions.  Good sphincter tone.  Scant brown stool in the rectal vault.  No mass in rectal vault.  Stool Hemoccult negative.    Impression/Plan: 54 year old lady with recurrent left lower quadrant abdominal pain  -  history of recurrent diverticulitis-well-documented on least 1 CT scan.  Symptoms sound  identical to her prior bouts of diverticulitis.  She has improved considerably on a 10-day course of Cipro and Flagyl.  She looks good today.  Abdominal exam is benign. I suspect patients constipation continues to be suboptimally managed.  We talked about the spectrum of treatment for recurrent diverticulitis up to and including surgical resection.  Surgical referral not indicated at this time. GERD fairly well controlled on diet.  No alarm symptoms.  Prior GI evaluation as outlined.   No need for endoscopic evaluation at this time.  Recommendations:  Increase Benefiber to 2 tablespoons daily (take all of it in the morning)  Take MiraLAX 1 capful every evening -without fail  For the next week or so, I would like you to take a second dose of MiraLAX in the morning (stop the second dose only if your stools get too loose)  No need for a CT or further antibiotics at this time.  No need to see a surgeon for recurrent diverticulitis at this time  We will plan for a updated colonoscopy the first of the year (2022)  Continue to watch diet to control your reflux.  Reflux information/diet provided  Office visit here in January 2022  Please call and let us know how you are doing in about a week to 10 days on the new regimen.   Notice: This dictation was prepared with Dragon dictation along with smaller phrase technology. Any transcriptional errors that result from this process are  unintentional and may not be corrected upon review.

## 2020-06-19 NOTE — Patient Instructions (Signed)
Increase Benefiber to 2 tablespoons daily (take all of it in the morning)  Take MiraLAX 1 capful every evening -without fail  For the next week or so, I would like you to take a second dose of MiraLAX in the morning (stop the second dose only if your stools get too loose)  No need for a CT or further antibiotics at this time.  No need to see a surgeon for recurrent diverticulitis at this time  We will plan for a updated colonoscopy the first of the year (2022)  Continue to watch your diet to control your reflux.  Reflux information/diet provided  Office visit here in January 2022  Please call and let us know how you are doing in about a week to 10 days on the new regimen.

## 2020-07-02 ENCOUNTER — Telehealth: Payer: Self-pay | Admitting: General Practice

## 2020-07-02 NOTE — Telephone Encounter (Signed)
Communication noted.  For now, back off on diet to liquid/low residue. Stop Benefiber for now. Continue MiraLAX twice daily.  Lets touch base with her midweek and see how she is getting along.

## 2020-07-02 NOTE — Telephone Encounter (Signed)
Patient sent a progress via My Chart, please see below:   "Dr Jena Gauss asked that I update him after taking the Muralax and Benefiber twice a day. I am doing better with the constipation but and still have pain in my lower left abdomen. It has gotten worse the last few days.  I also feel pain when my bladder is full like it is pushing on a bad spot.  I feel like I do right before I have a full attack.   It is managalbe right now though."

## 2020-07-02 NOTE — Telephone Encounter (Signed)
Spoke with pt. Pt was given recommendations of Dr. Jena Gauss. Pt will back off on diet to liquid/low residue, d/c Benefiber , continue Miralax bid and call back with a progress report on Wednesday.

## 2020-07-02 NOTE — Telephone Encounter (Signed)
FYI poke with pt. Pt is taking 1 tablespoon of Miralax bid and 2 tablespoons of Benefiber qam and 1 tablespoon of Benefiber qpm.

## 2020-08-09 ENCOUNTER — Encounter: Payer: Self-pay | Admitting: Gastroenterology

## 2020-08-09 ENCOUNTER — Ambulatory Visit: Payer: Self-pay | Admitting: Gastroenterology

## 2020-08-09 ENCOUNTER — Other Ambulatory Visit: Payer: Self-pay

## 2020-08-09 ENCOUNTER — Ambulatory Visit (HOSPITAL_COMMUNITY)
Admission: RE | Admit: 2020-08-09 | Discharge: 2020-08-09 | Disposition: A | Discharge status: Home / Self Care | Payer: Self-pay | Source: Ambulatory Visit | Attending: Gastroenterology | Admitting: Gastroenterology

## 2020-08-09 DIAGNOSIS — R1032 Left lower quadrant pain: Secondary | ICD-10-CM

## 2020-08-09 LAB — POCT I-STAT CREATININE: Creatinine, Ser: 0.8 mg/dL (ref 0.44–1.00)

## 2020-08-09 MED ORDER — IOHEXOL 300 MG/ML  SOLN
100.0000 mL | Freq: Once | INTRAMUSCULAR | Status: AC | PRN
  Administered 2020-08-09: 13:00:00 100 mL via INTRAVENOUS

## 2020-08-09 MED ORDER — METRONIDAZOLE 500 MG PO TABS: 500.0000 mg | ORAL_TABLET | Freq: Three times a day (TID) | ORAL | 0 refills | Status: AC

## 2020-08-09 MED ORDER — BARIUM SULFATE 2.1 % PO SUSP
ORAL | Status: AC
  Filled 2020-08-09: qty 2

## 2020-08-09 MED ORDER — CIPROFLOXACIN HCL 500 MG PO TABS: 500.0000 mg | ORAL_TABLET | Freq: Two times a day (BID) | ORAL | 0 refills | Status: DC

## 2020-08-09 NOTE — Progress Notes (Signed)
Cc'ed to pcp °

## 2020-08-09 NOTE — Patient Instructions (Signed)
I sent in Cipro and Flagyl to take for 10 days. Take Cipro twice a day and Flagyl three times per day. If you have any worsening over the weekend, please go to the ED.  We have ordered a stat CT. I will be calling with results.  Please follow clear liquids today. Once pain starts improving, you can advance to soft foods and follow that for about 14 days.   Please keep appointment upcoming with Dr. Jena Gauss!  Call if any concerns in meantime!  It was a pleasure to see you today. I want to create trusting relationships with patients to provide genuine, compassionate, and quality care. I value your feedback. If you receive a survey regarding your visit,  I greatly appreciate you taking time to fill this out.   [name], PhD, ANP-BC [facility]

## 2020-08-09 NOTE — Progress Notes (Signed)
Referring Provider: Assunta Found, MD Primary Care Physician:  Assunta Found, MD Primary GI: Dr. Jena Gauss    Chief Complaint  Patient presents with  . Abdominal Pain    Llq started last night, nausea last night    HPI:   Joyce Davis is a 54 y.o. female presenting today with a history of multiple episodes of diverticulitis, with last CT documented in Aug 2018. Treated empirically for diverticulitis with Cipro and Flagyl for 10 day course in Oct 2021. No imaging at that time, as she was out of state and advised to call if no improvement. Seen in Nov 2021 at [facility] and doing well.   Prior diverticulitis history: Aug 2018 CT documented.  Treated empirically again thereafter a few months later for similar symptoms. Again in March 2020 with Cipro and Flagyl.   2017: colonoscopy with scattered small and large-mouthed diverticula in entire colon.   Woke at 1am, felt needed to have a BM. Exruciating pain and sweat lower abdomen. Thought she had food poisoning. Associated nausea. Laid in floor about an hour. Today feels like she is going to drop something out of her stomach. Pain in LLQ.   Possible fever overnight but not documented. No NSAIDs. Has not needed Miralax recently. Bowel regimen doing well with supplemental fiber.   Past Medical History:  Diagnosis Date  . Diverticulitis   . Hypothyroidism   . Thyroid disease     Past Surgical History:  Procedure Laterality Date  . COLONOSCOPY N/A 06/20/2016   colonoscopy with scattered small and large-mouthed diverticula in entire colon.   . ENDOMETRIAL ABLATION    . EYE SURGERY Left   . LAPAROSCOPIC SUPRACERVICAL HYSTERECTOMY    . TUBAL LIGATION      Current Outpatient Medications  Medication Sig Dispense Refill  . ciprofloxacin (CIPRO) 500 MG tablet Take 1 tablet (500 mg total) by mouth 2 (two) times daily. 20 tablet 0  . hydrochlorothiazide (HYDRODIURIL) 25 MG tablet Take 25 mg by mouth daily.    Marland Kitchen levothyroxine (SYNTHROID)  112 MCG tablet Take 112 mcg by mouth daily.   1  . liothyronine (CYTOMEL) 5 MCG tablet Take 5 mcg by mouth daily.    . metroNIDAZOLE (FLAGYL) 500 MG tablet Take 1 tablet (500 mg total) by mouth 3 (three) times daily for 10 days. 30 tablet 0  . omeprazole (PRILOSEC) 20 MG capsule Take 20 mg by mouth as needed.    . polyethylene glycol powder (GLYCOLAX/MIRALAX) powder Take 17 g by mouth 2 (two) times daily.    Marland Kitchen PROGESTERONE PO Take by mouth. 150mg  daily    . Wheat Dextrin (BENEFIBER PO) Take by mouth daily.      No current facility-administered medications for this visit.   Facility-Administered Medications Ordered in Other Visits  Medication Dose Route Frequency Provider Last Rate Last Admin  . Barium Sulfate 2.1 % SUSP             Allergies as of 08/09/2020 - Review Complete 08/09/2020  Allergen Reaction Noted  . Bee venom Anaphylaxis and Swelling 05/28/2016  . Penicillins Hives 05/28/2016    Family History  Problem Relation Age of Onset  . Heart attack Paternal Grandfather   . Alzheimer's disease Paternal Grandmother   . Colon cancer Maternal Grandmother   . Cancer Maternal Grandfather        lung  . COPD Father   . Stroke Mother   . Stroke Brother   . Breast cancer Sister   .  Other Sister        mouth stays dry  . Stroke Sister        mini  . Other Sister        legs swell  . Breast cancer Maternal Aunt   . Breast cancer Maternal Aunt     Social History   Socioeconomic History  . Marital status: Married    Spouse name: Not on file  . Number of children: Not on file  . Years of education: Not on file  . Highest education level: Not on file  Occupational History  . Not on file  Tobacco Use  . Smoking status: Former Smoker    Years: 7.00    Types: Cigarettes  . Smokeless tobacco: Never Used  Substance and Sexual Activity  . Alcohol use: Yes    Comment: occ  . Drug use: No  . Sexual activity: Yes    Birth control/protection: Surgical    Comment:  supracervical hyst  Other Topics Concern  . Not on file  Social History Narrative  . Not on file   Social Determinants of Health   Financial Resource Strain: Not on file  Food Insecurity: Not on file  Transportation Needs: Not on file  Physical Activity: Not on file  Stress: Not on file  Social Connections: Not on file    Review of Systems: Gen: see HPI CV: Denies chest pain, palpitations, syncope, peripheral edema, and claudication. Resp: Denies dyspnea at rest, cough, wheezing, coughing up blood, and pleurisy. GI: see HPI Derm: Denies rash, itching, dry skin Psych: Denies depression, anxiety, memory loss, confusion. No homicidal or suicidal ideation.  Heme: Denies bruising, bleeding, and enlarged lymph nodes.  Physical Exam: BP 120/81   Pulse 93   Temp (!) 96.8 F (36 C) (Temporal)   Ht 5\' 6"  (1.676 m)   Wt 194 lb 6.4 oz (88.2 kg)   BMI 31.38 kg/m  General:   Alert and oriented. No distress noted. Pleasant and cooperative.  Head:  Normocephalic and atraumatic. Eyes:  Conjuctiva clear without scleral icterus. Mouth:  Mask in place Abdomen:  +BS, soft, TTP lower abdomen, LLQ and non-distended. No rebound or guarding. No HSM or masses noted. Msk:  Symmetrical without gross deformities. Normal posture. Extremities:  Without edema. Neurologic:  Alert and  oriented x4 Psych:  Alert and cooperative. Normal mood and affect.  ASSESSMENT/PLAN: Joyce Davis is a 54 y.o. female presenting today with likely recurrent diverticulitis, with symptoms similar to prior presentations. Notably, she has had more severe pain this time but is not in distress at time of visit. I believe we will be able to manage this as outpatient, but I'm recommending a CT scan to rule out any complicating features. Prior CT completed in 2018. She has been treated several times this year empirically with improvement. Colonoscopy will need to be completed once over acute episode and recovered.  CT  abd/pelvis stat today. Cipro and Flagyl provided X 10 days. Will adjust antibiotics if no significant response. To ED if worsening over weekend. Clear liquids for now. Advance to soft foods once discomfort subsides.   Keep Jan 2022 appt upcoming with Dr. Feb 2022.   Jena Gauss, PhD, ANP-BC [facility]

## 2020-08-21 ENCOUNTER — Ambulatory Visit (INDEPENDENT_AMBULATORY_CARE_PROVIDER_SITE_OTHER): Payer: Self-pay | Admitting: Internal Medicine

## 2020-08-21 ENCOUNTER — Other Ambulatory Visit: Payer: Self-pay

## 2020-08-21 ENCOUNTER — Encounter: Payer: Self-pay | Admitting: Internal Medicine

## 2020-08-21 VITALS — BP 112/74 | HR 74 | Temp 96.8°F | Ht 66.0 in | Wt 188.6 lb

## 2020-08-21 DIAGNOSIS — K219 Gastro-esophageal reflux disease without esophagitis: Secondary | ICD-10-CM

## 2020-08-21 DIAGNOSIS — R1032 Left lower quadrant pain: Secondary | ICD-10-CM

## 2020-08-21 DIAGNOSIS — K5732 Diverticulitis of large intestine without perforation or abscess without bleeding: Secondary | ICD-10-CM

## 2020-08-21 NOTE — Progress Notes (Signed)
Primary Care Physician:  Assunta Found, MD Primary Gastroenterologist:  Dr. Jena Gauss  Pre-Procedure History & Physical: HPI:  Joyce Davis is a 55 y.o. female here for follow-up of the CT documented sigmoid diverticulitis.  Recently seen for uncomplicated sigmoid diverticulitis.  She has now had at least 6 episodes since 2017;  2 of which documented on CT scan.  Tempo and severity seems to have increased.  Just finished Cipro and Flagyl 5 days ago.  Feels constipated.  Back on MiraLAX 17 g twice daily.  3 bowel movements daily.  Ate a salad 4 days ago and felt some of her pain return - no more than 2 out of 10 in severity.  No fever. Both parents have a history of colonic polyps;   she has been have diverticulosis only at 2017 colonoscopy; due for high risk screening colonoscopy later this year.  Does not feel like she is totally back to normal.  She has been working.  Reflux at least 3 times weekly.  Somewhat hesitant in taking Prilosec 20 mg every day prescribed here and by Dr. Phillips Odor.  No dysphagia.  Past Medical History:  Diagnosis Date  . Diverticulitis   . Hypothyroidism   . Thyroid disease     Past Surgical History:  Procedure Laterality Date  . COLONOSCOPY N/A 06/20/2016   colonoscopy with scattered small and large-mouthed diverticula in entire colon.   . ENDOMETRIAL ABLATION    . EYE SURGERY Left   . LAPAROSCOPIC SUPRACERVICAL HYSTERECTOMY    . TUBAL LIGATION      Prior to Admission medications   Medication Sig Start Date End Date Taking? Authorizing Provider  hydrochlorothiazide (HYDRODIURIL) 25 MG tablet Take 25 mg by mouth daily.   Yes [provider]  levothyroxine (SYNTHROID) 112 MCG tablet Take 112 mcg by mouth daily.  07/30/17  Yes [provider]  liothyronine (CYTOMEL) 5 MCG tablet Take 5 mcg by mouth daily.   Yes [provider]  omeprazole (PRILOSEC) 20 MG capsule Take 20 mg by mouth as needed.   Yes [provider]   polyethylene glycol powder (GLYCOLAX/MIRALAX) powder Take 17 g by mouth 2 (two) times daily.   Yes [provider]  PROGESTERONE PO Take by mouth. 150mg  daily   Yes [provider]    Allergies as of 08/21/2020 - Review Complete 08/21/2020  Allergen Reaction Noted  . Bee venom Anaphylaxis and Swelling 05/28/2016  . Penicillins Hives 05/28/2016    Family History  Problem Relation Age of Onset  . Heart attack Paternal Grandfather   . Alzheimer's disease Paternal Grandmother   . Colon cancer Maternal Grandmother   . Cancer Maternal Grandfather        lung  . COPD Father   . Stroke Mother   . Stroke Brother   . Breast cancer Sister   . Other Sister        mouth stays dry  . Stroke Sister        mini  . Other Sister        legs swell  . Breast cancer Maternal Aunt   . Breast cancer Maternal Aunt     Social History   Socioeconomic History  . Marital status: Married    Spouse name: Not on file  . Number of children: Not on file  . Years of education: Not on file  . Highest education level: Not on file  Occupational History  . Not on file  Tobacco Use  .  Smoking status: Former Smoker    Years: 7.00    Types: Cigarettes  . Smokeless tobacco: Never Used  Substance and Sexual Activity  . Alcohol use: Yes    Comment: occ  . Drug use: No  . Sexual activity: Yes    Birth control/protection: Surgical    Comment: supracervical hyst  Other Topics Concern  . Not on file  Social History Narrative  . Not on file   Social Determinants of Health   Financial Resource Strain: Not on file  Food Insecurity: Not on file  Transportation Needs: Not on file  Physical Activity: Not on file  Stress: Not on file  Social Connections: Not on file  Intimate Partner Violence: Not on file    Review of Systems: See HPI, otherwise negative ROS  Physical Exam: BP 112/74   Pulse 74   Temp (!) 96.8 F (36 C) (Temporal)   Ht 5\' 6"  (1.676 m)   Wt 188 lb 9.6 oz  (85.5 kg)   BMI 30.44 kg/m  General:   Alert,  Well-developed, well-nourished, pleasant and cooperative in NAD Mouth:  No deformity or lesions. Neck:  Supple; no masses or thyromegaly. No significant cervical adenopathy. Lungs:  Clear throughout to auscultation.   No wheezes, crackles, or rhonchi. No acute distress. Heart:  Regular rate and rhythm; no murmurs, clicks, rubs,  or gallops. Abdomen: Mildly obese.  Soft she has mild bilateral lower quadrant abdominal tenderness.  No rebound.  No guarding.  No mass. Pulses:  Normal pulses noted. Extremities:  Without clubbing or edema.  Impression/Plan: Pleasant 55 year old lady with multiple episodes of sigmoid diverticulitis over the past 5 years or so.  2 of which have been documented on CT scan.  Most recent episode has improved significantly - not back to baseline. 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  She does not appear acutely ill or toxic at this time.  Further antibiotic therapy not warranted. We discussed the fact that there is no way to prevent future episodes of diverticulitis.  The diverticulitis she has had thus far has been relatively mild to date-not requiring hospitalization or IV antibiotics. Significant GERD symptoms without alarm features.  No acid suppression therapy.  Likely in part related to significant weight gain. Positive family history of colon polyps in both parents.  Updated colonoscopy due this year. Given numerous bouts of diverticulitis, an elective surgical consultation at this time is reasonable.  Recommendations:  Low residue diet (information provided)  Continue miralax twice daily  GERD information provided  Take prilosec 20 mg everyday  No Benefiber for now  OV in 4 weeks  As discussed, would recommend seeing a general surgeon electively for consideration of segmental resection of your colon.  Will need an updated colonoscopy in the near future -hopefully, can schedule at her next  office visit..  Follow through on GYN update  As discussed, weight loss and exercise as feasible recommended          Notice: This dictation was prepared with Dragon dictation along with smaller phrase technology. Any transcriptional errors that result from this process are unintentional and may not be corrected upon review.

## 2020-08-21 NOTE — Patient Instructions (Signed)
Low residue diet (information provided)  Continue miralax twice daily  GERD information provided  Take prilosec 20 mg everyday  No Benefiber for now  OV in 4 weeks  As discussed, would recommend seeing a general surgeon electively for consideration of segmental resection of your colon.  Will need an updated colonoscopy in the near future

## 2020-09-06 ENCOUNTER — Other Ambulatory Visit: Payer: Self-pay | Admitting: Adult Health

## 2020-09-06 DIAGNOSIS — Z1231 Encounter for screening mammogram for malignant neoplasm of breast: Secondary | ICD-10-CM

## 2020-09-18 ENCOUNTER — Other Ambulatory Visit: Payer: Self-pay

## 2020-09-18 ENCOUNTER — Ambulatory Visit (INDEPENDENT_AMBULATORY_CARE_PROVIDER_SITE_OTHER): Payer: Self-pay | Admitting: Internal Medicine

## 2020-09-18 ENCOUNTER — Encounter: Payer: Self-pay | Admitting: Internal Medicine

## 2020-09-18 VITALS — BP 132/80 | HR 77 | Temp 96.8°F | Ht 66.0 in | Wt 188.8 lb

## 2020-09-18 DIAGNOSIS — K5732 Diverticulitis of large intestine without perforation or abscess without bleeding: Secondary | ICD-10-CM

## 2020-09-18 DIAGNOSIS — K219 Gastro-esophageal reflux disease without esophagitis: Secondary | ICD-10-CM

## 2020-09-18 NOTE — Progress Notes (Signed)
Primary Care Physician:  Assunta Found, MD Primary Gastroenterologist:  Dr. Jena Gauss  Pre-Procedure History & Physical: HPI:  Joyce Davis is a 55 y.o. female here for follow-up of recurrent bouts of sigmoid diverticulitis.  Patient feeling much better after the latest course of antibiotics.  Baseline constipation on MiraLAX.  Started a probiotic recently.  Due to multiple bouts of sigmoid diverticulitis, surgery consultation has been recommended.  Positive family history colon polyps in both parents.  Negative colonoscopy 2017; due for surveillance at this time.  GERD well controlled on Prilosec 20 mg daily.  She is now taking a probiotic daily.  She states she has a GYN appointment in April for well woman examination.  Past Medical History:  Diagnosis Date   Diverticulitis    Hypothyroidism    Thyroid disease     Past Surgical History:  Procedure Laterality Date   COLONOSCOPY N/A 06/20/2016   colonoscopy with scattered small and large-mouthed diverticula in entire colon.    ENDOMETRIAL ABLATION     EYE SURGERY Left    LAPAROSCOPIC SUPRACERVICAL HYSTERECTOMY     TUBAL LIGATION      Prior to Admission medications   Medication Sig Start Date End Date Taking? Authorizing Provider  hydrochlorothiazide (HYDRODIURIL) 25 MG tablet Take 25 mg by mouth daily.   Yes [provider]  levothyroxine (SYNTHROID) 112 MCG tablet Take 112 mcg by mouth daily.  07/30/17  Yes [provider]  liothyronine (CYTOMEL) 5 MCG tablet Take 5 mcg by mouth daily.   Yes [provider]  omeprazole (PRILOSEC) 20 MG capsule Take 20 mg by mouth as needed.   Yes [provider]  polyethylene glycol powder (GLYCOLAX/MIRALAX) powder Take 17 g by mouth 2 (two) times daily.   Yes [provider]  Probiotic Product (PROBIOTIC ADVANCED PO) Take by mouth daily. Renew Life   Yes [provider]  PROGESTERONE PO Take by mouth. 150mg  daily   Yes  [provider]    Allergies as of 09/18/2020 - Review Complete 09/18/2020  Allergen Reaction Noted   Bee venom Anaphylaxis and Swelling 05/28/2016   Penicillins Hives 05/28/2016    Family History  Problem Relation Age of Onset   Heart attack Paternal Grandfather    Alzheimer's disease Paternal Grandmother    Colon cancer Maternal Grandmother    Cancer Maternal Grandfather        lung   COPD Father    Stroke Mother    Stroke Brother    Breast cancer Sister    Other Sister        mouth stays dry   Stroke Sister        mini   Other Sister        legs swell   Breast cancer Maternal Aunt    Breast cancer Maternal Aunt     Social History   Socioeconomic History   Marital status: Married    Spouse name: Not on file   Number of children: Not on file   Years of education: Not on file   Highest education level: Not on file  Occupational History   Not on file  Tobacco Use   Smoking status: Former Smoker    Years: 7.00    Types: Cigarettes   Smokeless tobacco: Never Used  Substance and Sexual Activity   Alcohol use: Yes    Comment: occ   Drug use: No   Sexual activity: Yes    Birth control/protection: Surgical  Comment: supracervical hyst  Other Topics Concern   Not on file  Social History Narrative   Not on file   Social Determinants of Health   Financial Resource Strain: Not on file  Food Insecurity: Not on file  Transportation Needs: Not on file  Physical Activity: Not on file  Stress: Not on file  Social Connections: Not on file  Intimate Partner Violence: Not on file    Review of Systems: See HPI, otherwise negative ROS  Physical Exam: BP 132/80    Pulse 77    Temp (!) 96.8 F (36 C) (Temporal)    Ht 5\' 6"  (1.676 m)    Wt 188 lb 12.8 oz (85.6 kg)    BMI 30.47 kg/m  General:   Alert,  well-nourished, pleasant and cooperative in NAD Neck:  Supple; no masses or thyromegaly. No significant cervical  adenopathy. Lungs:  Clear throughout to auscultation.   No wheezes, crackles, or rhonchi. No acute distress. Heart:  Regular rate and rhythm; no murmurs, clicks, rubs,  or gallops. Abdomen: Non-distended, normal bowel sounds.  Soft and nontender without appreciable mass or hepatosplenomegaly.  Pulses:  Normal pulses noted. Extremities:  Without clubbing or edema.  Impression/Plan: Pleasant 55 year old lady with recurrent bouts of sigmoid diverticulitis - now clinically well.  Needs a colonoscopy given recurrent bouts of diverticulitis diverticulitis -well-documented  on cross-sectional imaging.  And a positive family history of colon polyps in 2 first-degree relatives.  We discussed the need for elective surgical consultation at some point in the near future  GERD well controlled on Prilosec 20 mg daily  Recommendations:  High risk  screening colonoscopy in the near future.  The risks, benefits, limitations, alternatives and imponderables have been reviewed with the patient. Questions have been answered.  Patient agreeable.  We will utilize propofol.  ASA 2.    Continue Prilosec 20 mg daily  Follow through on GYN evaluation as previously recommended.  Add Benefiber.  Continue MiraLAX daily as needed  We will discuss surgical consultation further after colonoscopy completed.     Notice: This dictation was prepared with Dragon dictation along with smaller phrase technology. Any transcriptional errors that result from this process are unintentional and may not be corrected upon review.

## 2020-09-18 NOTE — Patient Instructions (Signed)
We will schedule a high screening colonoscopy in the near future.  ASA 2/propofol  Continue Prilosec 20 mg daily  Continue MiraLAX 17 g daily as needed for constipation  Begin Benefiber 1 tablespoon daily in 3 to 4 weeks from now increase to 2 tablespoons daily  Glad you have a GYN evaluation set up  We will discuss general surgery consultation after colonoscopy is completed.  Further recommendations to follow.

## 2020-10-23 ENCOUNTER — Other Ambulatory Visit (HOSPITAL_COMMUNITY)
Admission: RE | Admit: 2020-10-23 | Discharge: 2020-10-23 | Disposition: A | Discharge status: Home / Self Care | Payer: No Typology Code available for payment source | Source: Ambulatory Visit | Attending: Internal Medicine | Admitting: Internal Medicine

## 2020-10-23 ENCOUNTER — Other Ambulatory Visit: Payer: Self-pay

## 2020-10-23 ENCOUNTER — Ambulatory Visit: Payer: No Typology Code available for payment source

## 2020-10-23 DIAGNOSIS — Z20822 Contact with and (suspected) exposure to covid-19: Secondary | ICD-10-CM | POA: Insufficient documentation

## 2020-10-23 DIAGNOSIS — Z01812 Encounter for preprocedural laboratory examination: Secondary | ICD-10-CM | POA: Insufficient documentation

## 2020-10-23 LAB — BASIC METABOLIC PANEL
Anion gap: 11 (ref 5–15)
BUN: 25 mg/dL — ABNORMAL HIGH (ref 6–20)
CO2: 26 mmol/L (ref 22–32)
Calcium: 9.5 mg/dL (ref 8.9–10.3)
Chloride: 103 mmol/L (ref 98–111)
Creatinine, Ser: 0.74 mg/dL (ref 0.44–1.00)
GFR, Estimated: >60 mL/min (ref >60)
Glucose, Bld: 97 mg/dL (ref 70–99)
Potassium: 3.7 mmol/L (ref 3.5–5.1)
Sodium: 140 mmol/L (ref 135–145)

## 2020-10-23 LAB — SARS CORONAVIRUS 2 (TAT 6-24 HRS): SARS Coronavirus 2: NEGATIVE

## 2020-10-25 ENCOUNTER — Ambulatory Visit (HOSPITAL_COMMUNITY): Payer: No Typology Code available for payment source | Admitting: Anesthesiology

## 2020-10-25 ENCOUNTER — Encounter (HOSPITAL_COMMUNITY)
Admission: RE | Disposition: A | Discharge status: Home / Self Care | Payer: Self-pay | Source: Ambulatory Visit | Attending: Internal Medicine

## 2020-10-25 ENCOUNTER — Ambulatory Visit (HOSPITAL_COMMUNITY)
Admission: RE | Admit: 2020-10-25 | Discharge: 2020-10-25 | Disposition: A | Discharge status: Home / Self Care | Payer: No Typology Code available for payment source | Source: Ambulatory Visit | Attending: Internal Medicine | Admitting: Internal Medicine

## 2020-10-25 ENCOUNTER — Other Ambulatory Visit: Payer: Self-pay

## 2020-10-25 DIAGNOSIS — K640 First degree hemorrhoids: Secondary | ICD-10-CM | POA: Insufficient documentation

## 2020-10-25 DIAGNOSIS — R933 Abnormal findings on diagnostic imaging of other parts of digestive tract: Secondary | ICD-10-CM

## 2020-10-25 DIAGNOSIS — Z820 Family history of epilepsy and other diseases of the nervous system: Secondary | ICD-10-CM | POA: Insufficient documentation

## 2020-10-25 DIAGNOSIS — Z880 Allergy status to penicillin: Secondary | ICD-10-CM | POA: Insufficient documentation

## 2020-10-25 DIAGNOSIS — Z8249 Family history of ischemic heart disease and other diseases of the circulatory system: Secondary | ICD-10-CM | POA: Insufficient documentation

## 2020-10-25 DIAGNOSIS — Z87891 Personal history of nicotine dependence: Secondary | ICD-10-CM | POA: Insufficient documentation

## 2020-10-25 DIAGNOSIS — Z91030 Bee allergy status: Secondary | ICD-10-CM | POA: Insufficient documentation

## 2020-10-25 DIAGNOSIS — Z803 Family history of malignant neoplasm of breast: Secondary | ICD-10-CM | POA: Insufficient documentation

## 2020-10-25 DIAGNOSIS — Z801 Family history of malignant neoplasm of trachea, bronchus and lung: Secondary | ICD-10-CM | POA: Insufficient documentation

## 2020-10-25 DIAGNOSIS — K219 Gastro-esophageal reflux disease without esophagitis: Secondary | ICD-10-CM | POA: Insufficient documentation

## 2020-10-25 DIAGNOSIS — Z7952 Long term (current) use of systemic steroids: Secondary | ICD-10-CM | POA: Insufficient documentation

## 2020-10-25 DIAGNOSIS — Z79899 Other long term (current) drug therapy: Secondary | ICD-10-CM | POA: Insufficient documentation

## 2020-10-25 DIAGNOSIS — K5730 Diverticulosis of large intestine without perforation or abscess without bleeding: Secondary | ICD-10-CM

## 2020-10-25 HISTORY — PX: COLONOSCOPY WITH PROPOFOL: SHX5780

## 2020-10-25 SURGERY — COLONOSCOPY WITH PROPOFOL
Anesthesia: General

## 2020-10-25 MED ORDER — PROPOFOL 10 MG/ML IV BOLUS
INTRAVENOUS | Status: DC | PRN
  Administered 2020-10-25: 50 mg via INTRAVENOUS
  Administered 2020-10-25: 100 mg via INTRAVENOUS

## 2020-10-25 MED ORDER — LIDOCAINE HCL (CARDIAC) PF 100 MG/5ML IV SOSY
PREFILLED_SYRINGE | INTRAVENOUS | Status: DC | PRN
  Administered 2020-10-25: 50 mg via INTRAVENOUS

## 2020-10-25 MED ORDER — STERILE WATER FOR IRRIGATION IR SOLN
Status: DC | PRN
  Administered 2020-10-25: 100 mL

## 2020-10-25 MED ORDER — PROPOFOL 500 MG/50ML IV EMUL
INTRAVENOUS | Status: DC | PRN
  Administered 2020-10-25: 150 ug/kg/min via INTRAVENOUS

## 2020-10-25 MED ORDER — LACTATED RINGERS IV SOLN: INTRAVENOUS | Status: DC

## 2020-10-25 NOTE — Discharge Instructions (Signed)
Colonoscopy Discharge Instructions  Read the instructions outlined below and refer to this sheet in the next few weeks. These discharge instructions provide you with general information on caring for yourself after you leave the hospital. Your doctor may also give you specific instructions. While your treatment has been planned according to the most current medical practices available, unavoidable complications occasionally occur. If you have any problems or questions after discharge, call Dr. Jena Gauss at [phone]. ACTIVITY  You may resume your regular activity, but move at a slower pace for the next 24 hours.   Take frequent rest periods for the next 24 hours.   Walking will help get rid of the air and reduce the bloated feeling in your belly (abdomen).   No driving for 24 hours (because of the medicine (anesthesia) used during the test).    Do not sign any important legal documents or operate any machinery for 24 hours (because of the anesthesia used during the test).  NUTRITION  Drink plenty of fluids.   You may resume your normal diet as instructed by your doctor.   Begin with a light meal and progress to your normal diet. Heavy or fried foods are harder to digest and may make you feel sick to your stomach (nauseated).   Avoid alcoholic beverages for 24 hours or as instructed.  MEDICATIONS  You may resume your normal medications unless your doctor tells you otherwise.  WHAT YOU CAN EXPECT TODAY  Some feelings of bloating in the abdomen.   Passage of more gas than usual.   Spotting of blood in your stool or on the toilet paper.  IF YOU HAD POLYPS REMOVED DURING THE COLONOSCOPY:  No aspirin products for 7 days or as instructed.   No alcohol for 7 days or as instructed.   Eat a soft diet for the next 24 hours.  FINDING OUT THE RESULTS OF YOUR TEST Not all test results are available during your visit. If your test results are not back during the visit, make an appointment  with your caregiver to find out the results. Do not assume everything is normal if you have not heard from your caregiver or the medical facility. It is important for you to follow up on all of your test results.  SEEK IMMEDIATE MEDICAL ATTENTION IF:  You have more than a spotting of blood in your stool.   Your belly is swollen (abdominal distention).   You are nauseated or vomiting.   You have a temperature over 101.   You have abdominal pain or discomfort that is severe or gets worse throughout the day.    No polyp or tumor found today  You do have diverticulosis throughout your colon (more concentrated on the left side-where you have had problems)  Recommend a repeat colonoscopy in 5 years for high risk screening  As discussed, I also recommend an elective surgical consultation for discussion regarding removing the left side of your colon  Office visit with [place] in 3 months  At patient request, I called Lisbeth Puller at [phone] voicemail-left a detailed message.  PATIENT INSTRUCTIONS POST-ANESTHESIA  IMMEDIATELY FOLLOWING SURGERY:  Do not drive or operate machinery for the first twenty four hours after surgery.  Do not make any important decisions for twenty four hours after surgery or while taking narcotic pain medications or sedatives.  If you develop intractable nausea and vomiting or a severe headache please notify your doctor immediately.  FOLLOW-UP:  Please make an appointment with your surgeon as  instructed. You do not need to follow up with anesthesia unless specifically instructed to do so.  WOUND CARE INSTRUCTIONS (if applicable):  Keep a dry clean dressing on the anesthesia/puncture wound site if there is drainage.  Once the wound has quit draining you may leave it open to air.  Generally you should leave the bandage intact for twenty four hours unless there is drainage.  If the epidural site drains for more than 36-48 hours please call the anesthesia  department.  QUESTIONS?:  Please feel free to call your physician or the hospital operator if you have any questions, and they will be happy to assist you.

## 2020-10-25 NOTE — H&P (Signed)
@LOGO @   Primary Care Physician:  , MD Primary Gastroenterologist:  Dr.   Pre-Procedure History & Physical: HPI:  Joyce Davis is a 55 y.o. female here for diagnostic colonoscopy.  History of multiple bouts of diverticulitis.  Colon on CT.  Currently, without any abdominal pain fever or other GI symptoms aside from GERD well-controlled on PPI. Past Medical History:  Diagnosis Date  . Diverticulitis   . Hypothyroidism   . Thyroid disease     Past Surgical History:  Procedure Laterality Date  . COLONOSCOPY N/A 06/20/2016   colonoscopy with scattered small and large-mouthed diverticula in entire colon.   . ENDOMETRIAL ABLATION    . EYE SURGERY Left   . LAPAROSCOPIC SUPRACERVICAL HYSTERECTOMY    . TUBAL LIGATION      Prior to Admission medications   Medication Sig Start Date End Date Taking? Authorizing Provider  hydrochlorothiazide (HYDRODIURIL) 25 MG tablet Take 25 mg by mouth daily.   Yes [provider]  levothyroxine (SYNTHROID) 112 MCG tablet Take 112 mcg by mouth daily before breakfast. 07/30/17  Yes [provider]  liothyronine (CYTOMEL) 5 MCG tablet Take 5 mcg by mouth daily.   Yes [provider]  omeprazole (PRILOSEC) 20 MG capsule Take 20 mg by mouth daily as needed (heartburn).   Yes [provider]  polyethylene glycol powder (GLYCOLAX/MIRALAX) powder Take 17 g by mouth 2 (two) times daily.   Yes [provider]  predniSONE (STERAPRED UNI-PAK 21 TAB) 10 MG (21) TBPK tablet Take 20 mg by mouth in the morning and at bedtime.   Yes [provider]  Probiotic Product (PROBIOTIC ADVANCED PO) Take 1 capsule by mouth daily. Renew Life   Yes [provider]  progesterone (PROMETRIUM) 200 MG capsule Take 200 mg by mouth daily.   Yes [provider]    Allergies as of 09/18/2020 - Review Complete 09/18/2020  Allergen Reaction Noted  . Bee venom Anaphylaxis and Swelling 05/28/2016  .  Penicillins Hives 05/28/2016    Family History  Problem Relation Age of Onset  . Heart attack Paternal Grandfather   . Alzheimer's disease Paternal Grandmother   . Colon cancer Maternal Grandmother   . Cancer Maternal Grandfather        lung  . COPD Father   . Stroke Mother   . Stroke Brother   . Breast cancer Sister   . Other Sister        mouth stays dry  . Stroke Sister        mini  . Other Sister        legs swell  . Breast cancer Maternal Aunt   . Breast cancer Maternal Aunt     Social History   Socioeconomic History  . Marital status: Married    Spouse name: Not on file  . Number of children: Not on file  . Years of education: Not on file  . Highest education level: Not on file  Occupational History  . Not on file  Tobacco Use  . Smoking status: Former Smoker    Years: 7.00    Types: Cigarettes  . Smokeless tobacco: Never Used  Substance and Sexual Activity  . Alcohol use: Yes    Comment: occ  . Drug use: No  . Sexual activity: Yes    Birth control/protection: Surgical    Comment: supracervical hyst  Other Topics Concern  . Not on file  Social History Narrative  . Not on file  Social Determinants of Health   Financial Resource Strain: Not on file  Food Insecurity: Not on file  Transportation Needs: Not on file  Physical Activity: Not on file  Stress: Not on file  Social Connections: Not on file  Intimate Partner Violence: Not on file    Review of Systems: See HPI, otherwise negative ROS  Physical Exam: BP 132/90   Pulse 69   Temp 98.3 F (36.8 C) (Oral)   Resp 13   Ht 5\' 6"  (1.676 m)   Wt 88 kg   SpO2 97%   BMI 31.31 kg/m  General:   Alert,  Well-developed, well-nourished, pleasant and cooperative in NAD nopathy. Lungs:  Clear throughout to auscultation.   No wheezes, crackles, or rhonchi. No acute distress. Heart:  Regular rate and rhythm; no murmurs, clicks, rubs,  or gallops. Abdomen: Non-distended, normal bowel sounds.  Soft  and nontender without appreciable mass or hepatosplenomegaly.  Pulses:  Normal pulses noted. Extremities:  Without clubbing or edema.  Impression/Plan: 55 year old lady with recurrent bouts of diverticulitis/abnormal colon on CT. I have offered the patient a diagnostic colonoscopy today per plan. The risks, benefits, limitations, alternatives and imponderables have been reviewed with the patient. Questions have been answered. All parties are agreeable.      Notice: This dictation was prepared with Dragon dictation along with smaller phrase technology. Any transcriptional errors that result from this process are unintentional and may not be corrected upon review.

## 2020-10-25 NOTE — Op Note (Signed)
[facility] Patient Name: Joyce FurryJennifer Davis Procedure Date: 10/25/2020 8:48 AM MRN: [id number] Date of Birth: Mar 09, 1966 Attending MD: Joyce Davis , MD CSN: [id number] Age: [id number] Admit Type: Outpatient Procedure:                Colonoscopy Indications:              Abnormal CT of the GI tract; recent, recurrent                            diverticulitis Providers:                Joyce Pacobert Michael Joyce Cimo, MD, Joyce FavaJessica Davis,                            Joyce LeiterNeville Davis, Technician Referring MD:              Medicines:                Propofol per Anesthesia Complications:            No immediate complications. Estimated Blood Loss:     Estimated blood loss: none. Procedure:                Pre-Anesthesia Assessment:                           - Prior to the procedure, a History and Physical                            was performed, and patient medications and                            allergies were reviewed. The patient's tolerance of                            previous anesthesia was also reviewed. The risks                            and benefits of the procedure and the sedation                            options and risks were discussed with the patient.                            All questions were answered, and informed consent                            was obtained. Prior Anticoagulants: The patient has                            taken no previous anticoagulant or antiplatelet                            agents. ASA Grade Assessment: II - A patient with                            mild  systemic disease. After reviewing the risks                            and benefits, the patient was deemed in                            satisfactory condition to undergo the procedure.                           After obtaining informed consent, the colonoscope                            was passed under direct vision. Throughout the                            procedure, the patient's blood  pressure, pulse, and                            oxygen saturations were monitored continuously. The                            CF-[id number] ([id number]) scope was introduced through                            the anus and advanced to the the cecum, identified                            by appendiceal orifice and ileocecal valve. The                            colonoscopy was performed without difficulty. The                            patient tolerated the procedure well. The quality                            of the bowel preparation was adequate. The                            ileocecal valve, appendiceal orifice, and rectum                            were photographed. Scope In: 9:01:13 AM Scope Out: 9:13:55 AM Scope Withdrawal Time: 0 hours 6 minutes 54 seconds  Total Procedure Duration: 0 hours 12 minutes 42 seconds  Findings:      The perianal and digital rectal examinations were normal.      Multiple medium-mouthed diverticula were found in the entire colon       (concentrated in the sigmoid with more sparse distribution in the more       proximal colon.      Non-bleeding internal hemorrhoids were found during retroflexion. The       hemorrhoids were mild, small and Grade I (internal hemorrhoids that do       not prolapse).      The exam was  otherwise without abnormality on direct and retroflexion       views. Impression:               - Diverticulosis in the entire examined colon.                           - Non-bleeding internal hemorrhoids.                           - The examination was otherwise normal on direct                            and retroflexion views.                           - No specimens collected. Moderate Sedation:      Moderate (conscious) sedation was personally administered by an       anesthesia professional. The following parameters were monitored: oxygen       saturation, heart rate, blood pressure, respiratory rate, EKG, adequacy       of pulmonary  ventilation, and response to care. Recommendation:           - Patient has a contact number available for                            emergencies. The signs and symptoms of potential                            delayed complications were discussed with the                            patient. Return to normal activities tomorrow.                            Written discharge instructions were provided to the                            patient.                           - Advance diet as tolerated. Continue Benefiber                            daily. As previously discussed and again today, I                            recommend an elective surgical consultation for a                            discussion regarding sigmoid resection. Patient                            understands there is no way to prevent a future                            bout of diverticulitis. Office  visit with [place] in 3                            months Procedure Code(s):        --- Professional ---                           [phone], Colonoscopy, flexible; diagnostic, including                            collection of specimen(s) by brushing or washing,                            when performed (separate procedure) Diagnosis Code(s):        --- Professional ---                           K64.0, First degree hemorrhoids                           K57.30, Diverticulosis of large intestine without                            perforation or abscess without bleeding                           R93.3, Abnormal findings on diagnostic imaging of                            other parts of digestive tract CPT copyright 2019 American [employer] Association. All rights reserved. The codes documented in this report are preliminary and upon coder review may  be revised to meet current compliance requirements. Gerrit Friends. Yuritza Paulhus, MD Joyce Pac, MD 10/25/2020 9:23:37 AM This report has been signed electronically. Number of Addenda: 0

## 2020-10-25 NOTE — Anesthesia Preprocedure Evaluation (Signed)
Anesthesia Evaluation  Patient identified by MRN, date of birth, ID band Patient awake    Reviewed: Allergy & Precautions, NPO status , Patient's Chart, lab work & pertinent test results  History of Anesthesia Complications Negative for: history of anesthetic complications  Airway Mallampati: II  TM Distance: >3 FB Neck ROM: Full    Dental  (+) Dental Advisory Given, Teeth Intact   Pulmonary former smoker,    Pulmonary exam normal breath sounds clear to auscultation       Cardiovascular Normal cardiovascular exam Rhythm:Regular Rate:Normal     Neuro/Psych negative neurological ROS  negative psych ROS   GI/Hepatic Neg liver ROS, GERD  Medicated and Controlled,  Endo/Other  Hypothyroidism   Renal/GU negative Renal ROS  negative genitourinary   Musculoskeletal negative musculoskeletal ROS (+)   Abdominal   Peds  Hematology   Anesthesia Other Findings   Reproductive/Obstetrics negative OB ROS                             Anesthesia Physical Anesthesia Plan  ASA: II  Anesthesia Plan: General   Post-op Pain Management:    Induction:   PONV Risk Score and Plan: Propofol infusion  Airway Management Planned: Nasal Cannula and Natural Airway  Additional Equipment:   Intra-op Plan:   Post-operative Plan:   Informed Consent: I have reviewed the patients History and Physical, chart, labs and discussed the procedure including the risks, benefits and alternatives for the proposed anesthesia with the patient or authorized representative who has indicated his/her understanding and acceptance.     Dental advisory given  Plan Discussed with: CRNA and Surgeon  Anesthesia Plan Comments:         Anesthesia Quick Evaluation

## 2020-10-25 NOTE — Transfer of Care (Signed)
Immediate Anesthesia Transfer of Care Note  Patient: Joyce Davis  Procedure(s) Performed: COLONOSCOPY WITH PROPOFOL (N/A )  Patient Location: Endoscopy Unit  Anesthesia Type:General  Level of Consciousness: awake, alert  and oriented  Airway & Oxygen Therapy: Patient Spontanous Breathing  Post-op Assessment: Report given to RN, Post -op Vital signs reviewed and stable and Patient moving all extremities X 4  Post vital signs: Reviewed and stable  Last Vitals:  Vitals Value Taken Time  BP    Temp    Pulse    Resp    SpO2      Last Pain:  Vitals:   10/25/20 0857  TempSrc:   PainSc: 4       Patients Stated Pain Goal: 5 (10/25/20 0802)  Complications: No complications documented.

## 2020-10-25 NOTE — Anesthesia Procedure Notes (Signed)
Date/Time: 10/25/2020 9:04 AM Performed by: Julian Reil, CRNA Pre-anesthesia Checklist: Patient identified, Emergency Drugs available, Suction available and Patient being monitored Patient Re-evaluated:Patient Re-evaluated prior to induction Oxygen Delivery Method: Nasal cannula Induction Type: IV induction Placement Confirmation: positive ETCO2

## 2020-10-25 NOTE — Anesthesia Postprocedure Evaluation (Signed)
Anesthesia Post Note  Patient: Joyce Davis  Procedure(s) Performed: COLONOSCOPY WITH PROPOFOL (N/A )  Patient location during evaluation: Endoscopy Anesthesia Type: General Level of consciousness: awake and alert and oriented Pain management: pain level controlled Vital Signs Assessment: post-procedure vital signs reviewed and stable Respiratory status: spontaneous breathing, nonlabored ventilation and respiratory function stable Cardiovascular status: blood pressure returned to baseline and stable Postop Assessment: no apparent nausea or vomiting Anesthetic complications: no   No complications documented.   Last Vitals:  Vitals:   10/25/20 0804 10/25/20 0916  BP: 132/90 128/88  Pulse:  81  Resp:  15  Temp:  36.4 C  SpO2: 97% 100%    Last Pain:  Vitals:   10/25/20 0916  TempSrc: Oral  PainSc: 0-No pain                 Julian Reil

## 2020-10-30 ENCOUNTER — Inpatient Hospital Stay: Admission: RE | Admit: 2020-10-30 | Payer: No Typology Code available for payment source | Source: Ambulatory Visit

## 2020-11-02 ENCOUNTER — Encounter (HOSPITAL_COMMUNITY): Payer: Self-pay | Admitting: Internal Medicine

## 2020-11-16 ENCOUNTER — Ambulatory Visit
Admission: RE | Admit: 2020-11-16 | Discharge: 2020-11-16 | Disposition: A | Discharge status: Home / Self Care | Payer: Self-pay | Source: Ambulatory Visit | Attending: Adult Health | Admitting: Adult Health

## 2020-11-16 ENCOUNTER — Other Ambulatory Visit: Payer: Self-pay

## 2020-11-16 DIAGNOSIS — Z1231 Encounter for screening mammogram for malignant neoplasm of breast: Secondary | ICD-10-CM

## 2020-11-27 ENCOUNTER — Ambulatory Visit: Payer: No Typology Code available for payment source | Admitting: Adult Health

## 2020-11-27 ENCOUNTER — Other Ambulatory Visit: Payer: Self-pay

## 2020-11-27 ENCOUNTER — Encounter: Payer: Self-pay | Admitting: Adult Health

## 2020-11-27 ENCOUNTER — Other Ambulatory Visit (HOSPITAL_COMMUNITY)
Admission: RE | Admit: 2020-11-27 | Discharge: 2020-11-27 | Disposition: A | Discharge status: Home / Self Care | Payer: No Typology Code available for payment source | Source: Ambulatory Visit | Attending: Adult Health | Admitting: Adult Health

## 2020-11-27 VITALS — BP 119/73 | HR 92 | Ht 66.0 in | Wt 190.0 lb

## 2020-11-27 DIAGNOSIS — F419 Anxiety disorder, unspecified: Secondary | ICD-10-CM | POA: Insufficient documentation

## 2020-11-27 DIAGNOSIS — R102 Pelvic and perineal pain: Secondary | ICD-10-CM | POA: Insufficient documentation

## 2020-11-27 DIAGNOSIS — Z1211 Encounter for screening for malignant neoplasm of colon: Secondary | ICD-10-CM

## 2020-11-27 DIAGNOSIS — Z01419 Encounter for gynecological examination (general) (routine) without abnormal findings: Secondary | ICD-10-CM | POA: Insufficient documentation

## 2020-11-27 DIAGNOSIS — R399 Unspecified symptoms and signs involving the genitourinary system: Secondary | ICD-10-CM | POA: Insufficient documentation

## 2020-11-27 LAB — HEMOCCULT GUIAC POC 1CARD (OFFICE): Fecal Occult Blood, POC: NEGATIVE

## 2020-11-27 LAB — POCT URINALYSIS DIPSTICK OB
Glucose, UA: NEGATIVE
Ketones, UA: NEGATIVE
Nitrite, UA: NEGATIVE
POC,PROTEIN,UA: NEGATIVE

## 2020-11-27 MED ORDER — PHENAZOPYRIDINE HCL 200 MG PO TABS: 200.0000 mg | ORAL_TABLET | Freq: Three times a day (TID) | ORAL | 0 refills | Status: DC | PRN

## 2020-11-27 MED ORDER — SULFAMETHOXAZOLE-TRIMETHOPRIM 800-160 MG PO TABS: 1.0000 | ORAL_TABLET | Freq: Two times a day (BID) | ORAL | 0 refills | Status: DC

## 2020-11-27 MED ORDER — HYDROXYZINE HCL 10 MG PO TABS: 10.0000 mg | ORAL_TABLET | Freq: Three times a day (TID) | ORAL | 0 refills | Status: DC | PRN

## 2020-11-27 NOTE — Progress Notes (Signed)
Patient ID: Joyce Davis, female   DOB: [birth year], 55 y.o.   MRN: [id number] History of Present Illness: Joyce Davis is a 55 year old white female, married, sp [facility], in for well woman gyn exam and pap. Last pap was in 2017. She had pain Thursday like UTI and started flagyl and felt better and then yesterday felt worse low back pain esp on the right and urinary frequency, has had kidney stone in the past. Has been told has Raynaud's disease, secondary to stress. She had history of diverticulitis and had colonoscopy by Dr Jena Gauss. PCP is Dr Phillips Odor.   Current Medications, Allergies, Past Medical History, Past Surgical History, Family History and Social History were reviewed in [employer] record.     Review of Systems: Patient denies any headaches, hearing loss, fatigue, blurred vision, shortness of breath, chest pain, abdominal pain, problems with bowel movements, or intercourse. No joint pain or mood swings. See HPI for positives.   Physical Exam:BP 119/73 (BP Location: Right Arm, Patient Position: Sitting, Cuff Size: Normal)   Pulse 92   Ht 5\' 6"  (1.676 m)   Wt 190 lb (86.2 kg)   BMI 30.67 kg/m  urine dipstick + small blood and leuks General:  Well developed, well nourished, no acute distress Skin:  Warm and dry Neck:  Midline trachea, normal thyroid, good ROM, no lymphadenopathy Lungs; Clear to auscultation bilaterally Breast:  No dominant palpable mass, retraction, or nipple discharge Cardiovascular: Regular rate and rhythm Abdomen:  Soft, + tenderness over entire abdomen, no hepatosplenomegaly,mild right CVAT Pelvic:  External genitalia is normal in appearance, no lesions.  The vagina is normal in appearance. Urethra has no lesions or masses. The cervix is smooth, with stenotic os, pap with HR HPV genotyping performed.  Uterus is absent. No adnexal masses, positive tenderness noted.Bladder is non tender, no masses felt. Rectal: Good sphincter tone, no polyps, or  hemorrhoids felt.  Hemoccult negative. Extremities/musculoskeletal:  No swelling or varicosities noted, no clubbing or cyanosis Psych:  No mood changes, alert and cooperative,seems happy AA is 2 Fall risk is low PHQ 9 score is 6 GAD score is 7,does not want meds daily but open to prn meds   Upstream - 11/27/20 0846      Pregnancy Intention Screening   Does the patient want to become pregnant in the next year? No    Does the patient's partner want to become pregnant in the next year? No    Would the patient like to discuss contraceptive options today? No      Contraception Wrap Up   Current Method Female Sterilization   [facility]   End Method Female Sterilization   [facility]   Contraception Counseling Provided No         Examination chaperoned by [facility]  Impression and Plan: 1. Encounter for gynecological examination with Papanicolaou smear of cervix Pap sent Physical in 1 year Pap in 3 if normal Labs with PCP Mammogram yearly Colonoscopy per GI  2. Symptoms of urinary tract infection Will rx septra ds and pyridium, and push fluids, water, cranberry juice, apple juice, lemonade Could have another kidney stone, if pain increases let me know  3. Tenderness of female pelvic organs Will get GYN Psychologist, prison and probation services to assess  4. Encounter for screening fecal occult blood testing   5. Anxiety Will rx vistaril prn  Meds ordered this encounter  Medications  . sulfamethoxazole-trimethoprim (BACTRIM DS) 800-160 MG tablet    Sig: Take 1 tablet by mouth 2 (  two) times daily. Take 1 bid    Dispense:  14 tablet    Refill:  0    Order Specific Question:   Supervising Provider    Answer:   Despina Hidden, LUTHER H [[id number]]  . phenazopyridine (PYRIDIUM) 200 MG tablet    Sig: Take 1 tablet (200 mg total) by mouth 3 (three) times daily as needed for pain.    Dispense:  10 tablet    Refill:  0    Order Specific Question:   Supervising Provider    Answer:   Despina Hidden, LUTHER H [[id number]]  . hydrOXYzine (ATARAX/VISTARIL) 10 MG  tablet    Sig: Take 1 tablet (10 mg total) by mouth 3 (three) times daily as needed.    Dispense:  30 tablet    Refill:  0    Order Specific Question:   Supervising Provider    Answer:   Duane Lope H [[id number]]

## 2020-11-29 ENCOUNTER — Other Ambulatory Visit: Payer: Self-pay | Admitting: Adult Health

## 2020-11-29 LAB — CYTOLOGY - PAP
Comment: NEGATIVE
Diagnosis: NEGATIVE
High risk HPV: NEGATIVE

## 2020-11-29 MED ORDER — AZITHROMYCIN 250 MG PO TABS: ORAL_TABLET | ORAL | 0 refills | Status: DC

## 2020-11-29 NOTE — Progress Notes (Signed)
Will rx zpack.  

## 2020-12-26 ENCOUNTER — Other Ambulatory Visit: Payer: No Typology Code available for payment source

## 2021-01-24 ENCOUNTER — Other Ambulatory Visit: Payer: Self-pay

## 2021-01-24 ENCOUNTER — Encounter: Payer: Self-pay | Admitting: Gastroenterology

## 2021-01-24 ENCOUNTER — Ambulatory Visit: Payer: No Typology Code available for payment source | Admitting: Gastroenterology

## 2021-01-24 DIAGNOSIS — Z8719 Personal history of other diseases of the digestive system: Secondary | ICD-10-CM | POA: Insufficient documentation

## 2021-01-24 NOTE — Patient Instructions (Signed)
I recommend taking Benefiber 2 teaspoons daily and increase to 3 times a day if tolerated.  You can continue the Miralax as needed.  We will see you in 1 year, but please call us if any concerns in the meantime!  I enjoyed seeing you again today! As you know, I value our relationship and want to provide genuine, compassionate, and quality care. I welcome your feedback. If you receive a survey regarding your visit,  I greatly appreciate you taking time to fill this out. See you next time!  [name], PhD, ANP-BC [facility]

## 2021-01-24 NOTE — Progress Notes (Signed)
Referring Provider: Assunta Found, MD Primary Care Physician:  Assunta Found, MD Primary GI: Dr. Jena Gauss  Chief Complaint  Patient presents with   pp f/u    HPI:   Joyce Davis is a 55 y.o. female presenting today with a history of multiple episodes of diverticulitis, last in Dec 2021. Underwent colonoscopy March 2022: pancolonic diverticulosis, non-bleeding internal hemorrhoids.   Here for routine follow-up. She is doing quite well today. No further abdominal pain or diverticulitis episodes since Dec 2021. Taking Miralax BID and would like to continue on this. Will titrate as needed. No rectal bleeding. Good appetite. Not taking fiber yet. Would like to hold off on surgical consult.   Past Medical History:  Diagnosis Date   Diverticulitis    Hypothyroidism    Thyroid disease     Past Surgical History:  Procedure Laterality Date   COLONOSCOPY N/A 06/20/2016   colonoscopy with scattered small and large-mouthed diverticula in entire colon.    COLONOSCOPY WITH PROPOFOL N/A 10/25/2020   pancolonic diverticulosis, non-bleeding internal hemorrhoids.   ENDOMETRIAL ABLATION     EYE SURGERY Left    LAPAROSCOPIC SUPRACERVICAL HYSTERECTOMY     TUBAL LIGATION      Current Outpatient Medications  Medication Sig Dispense Refill   hydrochlorothiazide (HYDRODIURIL) 25 MG tablet Take 25 mg by mouth daily.     levothyroxine (SYNTHROID) 100 MCG tablet Take 100 mcg by mouth daily.     liothyronine (CYTOMEL) 5 MCG tablet Take 5 mcg by mouth daily.     omeprazole (PRILOSEC) 20 MG capsule Take 20 mg by mouth daily as needed (heartburn).     polyethylene glycol powder (GLYCOLAX/MIRALAX) powder Take 17 g by mouth 2 (two) times daily.     Probiotic Product (PROBIOTIC ADVANCED PO) Take 1 capsule by mouth daily. Renew Life     progesterone (PROMETRIUM) 200 MG capsule Take 200 mg by mouth daily.     Testosterone 25 MG PLLT Inject 37.5 mg as directed See admin instructions.     No current  facility-administered medications for this visit.    Allergies as of 01/24/2021 - Review Complete 01/24/2021  Allergen Reaction Noted   Bee venom Anaphylaxis and Swelling 05/28/2016   Penicillins Hives 05/28/2016    Family History  Problem Relation Age of Onset   Stroke Mother    Colon polyps Mother    COPD Father    Colon polyps Father    Breast cancer Sister    Other Sister        mouth stays dry   Stroke Sister        mini   Other Sister        legs swell   Stroke Brother    Colon cancer Maternal Grandmother    Cancer Maternal Grandfather        lung   Alzheimer's disease Paternal Grandmother    Heart attack Paternal Grandfather    Breast cancer Maternal Aunt    Breast cancer Maternal Aunt     Social History   Socioeconomic History   Marital status: Married    Spouse name: Not on file   Number of children: Not on file   Years of education: Not on file   Highest education level: Not on file  Occupational History   Not on file  Tobacco Use   Smoking status: Former    Years: 7.00    Pack years: 0.00    Types: Cigarettes   Smokeless  tobacco: Never  Vaping Use   Vaping Use: Never used  Substance and Sexual Activity   Alcohol use: Yes    Comment: occ   Drug use: No   Sexual activity: Yes    Birth control/protection: Surgical    Comment: supracervical hyst  Other Topics Concern   Not on file  Social History Narrative   Not on file   Social Determinants of Health   Financial Resource Strain: Low Risk    Difficulty of Paying Living Expenses: Not hard at all  Food Insecurity: No Food Insecurity   Worried About Programme researcher, broadcasting/film/video in the Last Year: Never true   Ran Out of Food in the Last Year: Never true  Transportation Needs: No Transportation Needs   Lack of Transportation (Medical): No   Lack of Transportation (Non-Medical): No  Physical Activity: Insufficiently Active   Days of Exercise per Week: 2 days   Minutes of Exercise per Session: 30 min   Stress: Stress Concern Present   Feeling of Stress : Rather much  Social Connections: Moderately Integrated   Frequency of Communication with Friends and Family: More than three times a week   Frequency of Social Gatherings with Friends and Family: Once a week   Attends Religious Services: More than 4 times per year   Active Member of [employer] or Organizations: No   Attends Banker Meetings: Never   Marital Status: Married    Review of Systems: Gen: Denies fever, chills, anorexia. Denies fatigue, weakness, weight loss.  CV: Denies chest pain, palpitations, syncope, peripheral edema, and claudication. Resp: Denies dyspnea at rest, cough, wheezing, coughing up blood, and pleurisy. GI: see HPI Derm: Denies rash, itching, dry skin Psych: Denies depression, anxiety, memory loss, confusion. No homicidal or suicidal ideation.  Heme: Denies bruising, bleeding, and enlarged lymph nodes.  Physical Exam: BP 120/84   Pulse 84   Temp (!) 96.9 F (36.1 C) (Temporal)   Ht 5\' 6"  (1.676 m)   Wt 191 lb (86.6 kg)   BMI 30.83 kg/m  General:   Alert and oriented. No distress noted. Pleasant and cooperative.  Head:  Normocephalic and atraumatic. Eyes:  Conjuctiva clear without scleral icterus. Mouth:  mask in place Abdomen:  +BS, soft, non-tender and non-distended. No rebound or guarding. No HSM or masses noted. Msk:  Symmetrical without gross deformities. Normal posture. Extremities:  Without edema. Neurologic:  Alert and  oriented x4 Psych:  Alert and cooperative. Normal mood and affect.  ASSESSMENT/PLAN: Joyce Davis is a 55 y.o. female presenting today with history of multiple recurrent episodes of diverticulitis and last in Dec 2021, doing well today and undergoing colonoscopy in interim from last visit.   She continues on Miralax BID, which she feels works best for her and would like to stick with this regimen. I have recommended adding Benefiber daily and titrating as  needed. She is declining surgical consult but aware of the risks of recurrence and worsening disease.   We will see her back in 1 year or sooner if needed.  03-07-1988, PhD, ANP-BC [facility]

## 2021-01-25 ENCOUNTER — Ambulatory Visit: Payer: Self-pay

## 2021-01-25 ENCOUNTER — Other Ambulatory Visit: Payer: Self-pay | Admitting: Adult Health

## 2021-01-25 DIAGNOSIS — R1031 Right lower quadrant pain: Secondary | ICD-10-CM

## 2021-01-25 DIAGNOSIS — R102 Pelvic and perineal pain: Secondary | ICD-10-CM

## 2021-01-25 NOTE — Progress Notes (Signed)
PELVIC US TA/TV: cervical stump with a complex nabothian cyst 2 x 2 x 1.3 cm, normal right ovary,simple left ovarian cyst  1.1 x .7 x 1 cm,no free fluid,unable to slide ovaries,bilat adnexal pain during ultrasound  Chaperone Peggy

## 2021-01-28 NOTE — Telephone Encounter (Signed)
Pt aware that US showed simple cyst left ovary and that ovaries do not slide, so that may be source of discomfort. She said [place] hurt. Keep look out on my chart when Dr Despina Hidden reads [place]

## 2021-11-26 ENCOUNTER — Other Ambulatory Visit: Payer: Self-pay | Admitting: Adult Health Nurse Practitioner

## 2021-11-26 DIAGNOSIS — Z1231 Encounter for screening mammogram for malignant neoplasm of breast: Secondary | ICD-10-CM

## 2021-11-28 ENCOUNTER — Ambulatory Visit
Admission: RE | Admit: 2021-11-28 | Discharge: 2021-11-28 | Disposition: A | Discharge status: Home / Self Care | Payer: No Typology Code available for payment source | Source: Ambulatory Visit

## 2021-11-28 DIAGNOSIS — Z1231 Encounter for screening mammogram for malignant neoplasm of breast: Secondary | ICD-10-CM

## 2022-01-02 ENCOUNTER — Encounter: Payer: Self-pay | Admitting: Internal Medicine

## 2022-11-11 ENCOUNTER — Other Ambulatory Visit: Payer: Self-pay | Admitting: Adult Health

## 2022-11-11 DIAGNOSIS — Z1231 Encounter for screening mammogram for malignant neoplasm of breast: Secondary | ICD-10-CM

## 2022-12-25 ENCOUNTER — Ambulatory Visit
Admission: RE | Admit: 2022-12-25 | Discharge: 2022-12-25 | Disposition: A | Discharge status: Home / Self Care | Payer: Self-pay | Source: Ambulatory Visit | Attending: Adult Health | Admitting: Adult Health

## 2022-12-25 DIAGNOSIS — Z1231 Encounter for screening mammogram for malignant neoplasm of breast: Secondary | ICD-10-CM

## 2024-01-28 ENCOUNTER — Other Ambulatory Visit: Payer: Self-pay | Admitting: Adult Health Nurse Practitioner

## 2024-01-28 DIAGNOSIS — Z1231 Encounter for screening mammogram for malignant neoplasm of breast: Secondary | ICD-10-CM

## 2024-02-03 ENCOUNTER — Ambulatory Visit
Admission: RE | Admit: 2024-02-03 | Discharge: 2024-02-03 | Disposition: A | Discharge status: Home / Self Care | Payer: Self-pay | Source: Ambulatory Visit | Attending: Adult Health Nurse Practitioner | Admitting: Adult Health Nurse Practitioner

## 2024-02-03 DIAGNOSIS — Z1231 Encounter for screening mammogram for malignant neoplasm of breast: Secondary | ICD-10-CM
